# Patient Record
Sex: Male | Born: 1945 | Race: Black or African American | Hispanic: No | Smoking: Never smoker
Health system: Southern US, Community
[De-identification: ages and names within clinical notes are randomized; demographics above are authoritative.]

## PROBLEM LIST (undated history)

## (undated) DIAGNOSIS — F200 Paranoid schizophrenia: Secondary | ICD-10-CM

## (undated) DIAGNOSIS — M109 Gout, unspecified: Secondary | ICD-10-CM

## (undated) DIAGNOSIS — N400 Enlarged prostate without lower urinary tract symptoms: Secondary | ICD-10-CM

## (undated) DIAGNOSIS — F315 Bipolar disorder, current episode depressed, severe, with psychotic features: Secondary | ICD-10-CM

## (undated) DIAGNOSIS — F329 Major depressive disorder, single episode, unspecified: Secondary | ICD-10-CM

## (undated) DIAGNOSIS — F0390 Unspecified dementia without behavioral disturbance: Secondary | ICD-10-CM

## (undated) DIAGNOSIS — E119 Type 2 diabetes mellitus without complications: Secondary | ICD-10-CM

## (undated) DIAGNOSIS — F0280 Dementia in other diseases classified elsewhere without behavioral disturbance: Secondary | ICD-10-CM

## (undated) DIAGNOSIS — E559 Vitamin D deficiency, unspecified: Secondary | ICD-10-CM

## (undated) DIAGNOSIS — G309 Alzheimer's disease, unspecified: Secondary | ICD-10-CM

## (undated) DIAGNOSIS — E785 Hyperlipidemia, unspecified: Secondary | ICD-10-CM

## (undated) DIAGNOSIS — I10 Essential (primary) hypertension: Secondary | ICD-10-CM

## (undated) DIAGNOSIS — S069XAA Unspecified intracranial injury with loss of consciousness status unknown, initial encounter: Secondary | ICD-10-CM

## (undated) DIAGNOSIS — S069X9A Unspecified intracranial injury with loss of consciousness of unspecified duration, initial encounter: Secondary | ICD-10-CM

---

## 2017-07-03 ENCOUNTER — Emergency Department: Payer: Medicare (Managed Care)

## 2017-07-03 ENCOUNTER — Emergency Department
Admission: EM | Admit: 2017-07-03 | Discharge: 2017-07-03 | Disposition: A | Discharge status: Home / Self Care | Payer: Medicare (Managed Care) | Attending: Emergency Medicine | Admitting: Emergency Medicine

## 2017-07-03 ENCOUNTER — Other Ambulatory Visit: Payer: Self-pay

## 2017-07-03 DIAGNOSIS — I10 Essential (primary) hypertension: Secondary | ICD-10-CM | POA: Insufficient documentation

## 2017-07-03 DIAGNOSIS — R109 Unspecified abdominal pain: Secondary | ICD-10-CM | POA: Diagnosis not present

## 2017-07-03 DIAGNOSIS — N200 Calculus of kidney: Secondary | ICD-10-CM | POA: Diagnosis not present

## 2017-07-03 DIAGNOSIS — F0390 Unspecified dementia without behavioral disturbance: Secondary | ICD-10-CM | POA: Diagnosis not present

## 2017-07-03 DIAGNOSIS — R195 Other fecal abnormalities: Secondary | ICD-10-CM | POA: Diagnosis present

## 2017-07-03 HISTORY — DX: Essential (primary) hypertension: I10

## 2017-07-03 HISTORY — DX: Unspecified dementia, unspecified severity, without behavioral disturbance, psychotic disturbance, mood disturbance, and anxiety: F03.90

## 2017-07-03 LAB — URINALYSIS, COMPLETE (UACMP) WITH MICROSCOPIC
BILIRUBIN URINE: NEGATIVE
Bacteria, UA: NONE SEEN
Glucose, UA: NEGATIVE mg/dL
Hgb urine dipstick: NEGATIVE
KETONES UR: NEGATIVE mg/dL
LEUKOCYTES UA: NEGATIVE
Nitrite: NEGATIVE
PH: 8 (ref 5.0–8.0)
PROTEIN: 30 mg/dL — AB
Specific Gravity, Urine: 1.021 (ref 1.005–1.030)

## 2017-07-03 LAB — CBC
HCT: 41.1 % (ref 40.0–52.0)
Hemoglobin: 13.3 g/dL (ref 13.0–18.0)
MCH: 28.5 pg (ref 26.0–34.0)
MCHC: 32.4 g/dL (ref 32.0–36.0)
MCV: 87.9 fL (ref 80.0–100.0)
PLATELETS: 245 10*3/uL (ref 150–440)
RBC: 4.68 MIL/uL (ref 4.40–5.90)
RDW: 13.6 % (ref 11.5–14.5)
WBC: 4.8 10*3/uL (ref 3.8–10.6)

## 2017-07-03 LAB — COMPREHENSIVE METABOLIC PANEL
ALT: 22 U/L (ref 17–63)
AST: 38 U/L (ref 15–41)
Albumin: 4.3 g/dL (ref 3.5–5.0)
Alkaline Phosphatase: 84 U/L (ref 38–126)
Anion gap: 11 (ref 5–15)
BUN: 15 mg/dL (ref 6–20)
CALCIUM: 9.6 mg/dL (ref 8.9–10.3)
CO2: 28 mmol/L (ref 22–32)
CREATININE: 1.15 mg/dL (ref 0.61–1.24)
Chloride: 101 mmol/L (ref 101–111)
GLUCOSE: 136 mg/dL — AB (ref 65–99)
Potassium: 4 mmol/L (ref 3.5–5.1)
Sodium: 140 mmol/L (ref 135–145)
TOTAL PROTEIN: 7.8 g/dL (ref 6.5–8.1)
Total Bilirubin: 1.1 mg/dL (ref 0.3–1.2)

## 2017-07-03 LAB — PROTIME-INR
INR: 1
Prothrombin Time: 13.1 seconds (ref 11.4–15.2)

## 2017-07-03 LAB — TYPE AND SCREEN
ABO/RH(D): O POS
ANTIBODY SCREEN: NEGATIVE

## 2017-07-03 MED ORDER — ONDANSETRON 4 MG PO TBDP
4.0000 mg | ORAL_TABLET | Freq: Once | ORAL | Status: AC
  Administered 2017-07-03: 4 mg via ORAL
  Filled 2017-07-03: qty 1

## 2017-07-03 MED ORDER — TRAMADOL HCL 50 MG PO TABS: 50.0000 mg | ORAL_TABLET | Freq: Four times a day (QID) | ORAL | 0 refills | Status: DC | PRN

## 2017-07-03 MED ORDER — ONDANSETRON 4 MG PO TBDP: 4.0000 mg | ORAL_TABLET | Freq: Four times a day (QID) | ORAL | 0 refills | Status: DC | PRN

## 2017-07-03 MED ORDER — IOPAMIDOL (ISOVUE-300) INJECTION 61%
100.0000 mL | Freq: Once | INTRAVENOUS | Status: AC | PRN
  Administered 2017-07-03: 100 mL via INTRAVENOUS

## 2017-07-03 MED ORDER — QUETIAPINE FUMARATE 25 MG PO TABS
25.0000 mg | ORAL_TABLET | Freq: Once | ORAL | Status: AC
  Administered 2017-07-03: 25 mg via ORAL
  Filled 2017-07-03: qty 1

## 2017-07-03 MED ORDER — TRAMADOL HCL 50 MG PO TABS
50.0000 mg | ORAL_TABLET | ORAL | Status: AC
  Administered 2017-07-03: 50 mg via ORAL
  Filled 2017-07-03: qty 1

## 2017-07-03 MED ORDER — TRAMADOL HCL 50 MG PO TABS
ORAL_TABLET | ORAL | Status: AC
  Filled 2017-07-03: qty 1

## 2017-07-03 MED ORDER — CEPHALEXIN 500 MG PO CAPS: 500.0000 mg | ORAL_CAPSULE | Freq: Two times a day (BID) | ORAL | 0 refills | Status: AC

## 2017-07-03 MED ORDER — SODIUM CHLORIDE 0.9 % IV BOLUS (SEPSIS)
500.0000 mL | Freq: Once | INTRAVENOUS | Status: AC
  Administered 2017-07-03: 500 mL via INTRAVENOUS

## 2017-07-03 MED ORDER — QUETIAPINE FUMARATE 25 MG PO TABS
ORAL_TABLET | ORAL | Status: AC
  Filled 2017-07-03: qty 1

## 2017-07-03 MED ORDER — CEPHALEXIN 500 MG PO CAPS
500.0000 mg | ORAL_CAPSULE | Freq: Once | ORAL | Status: AC
  Administered 2017-07-03: 500 mg via ORAL
  Filled 2017-07-03: qty 1

## 2017-07-03 NOTE — ED Provider Notes (Signed)
[facility] Emergency Department Provider Note   ____________________________________________   First MD Initiated Contact with Patient 07/03/17 1006     (approximate)  I have reviewed the triage vital signs and the nursing notes.   HISTORY  Chief Complaint GI Bleeding  EM caveat: Limited by dementia, poor patient recall  HPI Jaime Miller is a 71 y.o. male who presents with his sister who she reports is his primary caretaker and power of attorney.  S[place], about 3 days ago where he was complaining his stomach hurt and he seemed to have a small constipated bowel movement.  The next day he had a normal bowel movement but noticed a small amount of blood tingeing to it, and then the day after he also had a bowel movement that again had some blood mixed in with it.  Stool is brown.  Sister reports that this has happened to him about once a year ago where he had similar symptoms, and they found he had a urinary tract infection and he got better.  He does not take any blood thinners except for aspirin.  She reports he has no history of bleeding in the past.  Does have a history of dementia for which he takes Seroquel in the mornings and has not had it today  She called her primary doctor set up an appointment in [place], but they advised that he come to the ER to be evaluated prior to any traveling.  Sister reports he does seem to be slightly worse in terms of his memory and "dementia" the last 4-5 days, but he is continued to be up walking, eating, drinking, and is not been any distress but does seem slightly more disoriented now they have been traveling.  No fevers or chills.  No vomiting.  He is not complaining any chest pain.  She has noticed an occasional dry cough the last several days  Past Medical History:  Diagnosis Date  . Dementia   . Hypertension     There are no active  problems to display for this patient.   History reviewed. No pertinent surgical history.  Prior to Admission medications   Medication Sig Start Date End Date Taking? Authorizing Provider  cephALEXin (KEFLEX) 500 MG capsule Take 1 capsule (500 mg total) by mouth 2 (two) times daily for 10 days. 07/03/17 07/13/17  Sharyn CreamerQuale, Mark, MD  ondansetron (ZOFRAN ODT) 4 MG disintegrating tablet Take 1 tablet (4 mg total) by mouth every 6 (six) hours as needed for nausea or vomiting. 07/03/17   Sharyn CreamerQuale, Mark, MD  traMADol (ULTRAM) 50 MG tablet Take 1 tablet (50 mg total) by mouth every 6 (six) hours as needed for severe pain. 07/03/17   Sharyn CreamerQuale, Mark, MD    Allergies Patient has no known allergies.  No family history on file.  Social History Social History   Tobacco Use  . Smoking status: Never Smoker  . Smokeless tobacco: Never Used  Substance Use Topics  . Alcohol use: No    Frequency: Never  . Drug use: No    Review of Systems Patient cannot provide reasonable history  Does not appear in any acute distress, and when asked he denies being any pain.  However, he cannot describe give a history as to why he is here today.   ____________________________________________   PHYSICAL EXAM:  VITAL SIGNS: ED Triage Vitals [07/03/17 0932]  Enc Vitals Group  BP 110/87     Pulse Rate 71     Resp 16     Temp 97.8 F (36.6 C)     Temp Source Oral     SpO2 99 %     Weight 184 lb (83.5 kg)     Height 6\' 2"  (1.88 m)     Head Circumference      Peak Flow      Pain Score      Pain Loc      Pain Edu?      Excl. in GC?     Constitutional: Alert and oriented to self and sister but not to year or location. Well appearing and in no acute distress.  Appears very well cared for, his sister is very pleasant and attentive. Eyes: Conjunctivae are normal. Head: Atraumatic. Nose: No congestion/rhinnorhea. Mouth/Throat: Mucous membranes are moist. Neck: No stridor.  No meningismus. Cardiovascular:  Normal rate, regular rhythm. Grossly normal heart sounds.  Good peripheral circulation. Respiratory: Normal respiratory effort.  No retractions. Lungs CTAB. Gastrointestinal: Soft and question of some tenderness as he seems to grimace slightly when palpated in the lower abdomen. No distention.  No rebound guarding or peritonitis.  Rectal exam performed with nurse Revonda Standard.  Formed brown stool with Hemoccult negative.  No hemorrhoids noted Musculoskeletal: No lower extremity tenderness nor edema. Neurologic: Very flat affect, no distress.  Skin:  Skin is warm, dry and intact. No rash noted.  Moves all extremities equally without any deficits noted Psychiatric: Mood and affect are flat. Speech and behavior are normal.  ____________________________________________   LABS (all labs ordered are listed, but only abnormal results are displayed)  Labs Reviewed  COMPREHENSIVE METABOLIC PANEL - Abnormal; Notable for the following components:      Result Value   Glucose, Bld 136 (*)    All other components within normal limits  URINALYSIS, COMPLETE (UACMP) WITH MICROSCOPIC - Abnormal; Notable for the following components:   Color, Urine YELLOW (*)    APPearance CLEAR (*)    Protein, ur 30 (*)    Squamous Epithelial / LPF 0-5 (*)    All other components within normal limits  CBC  PROTIME-INR  POC OCCULT BLOOD, ED  TYPE AND SCREEN   ____________________________________________  EKG  Reviewed interrupt me at 10:20 AM Heart rate 70 QRS 100 QTC 460 Normal sinus rhythm, no evidence of ischemia or ectopy ____________________________________________  RADIOLOGY  Ct Head Wo Contrast  Result Date: 07/03/2017 CLINICAL DATA:  Altered level of consciousness. EXAM: CT HEAD WITHOUT CONTRAST TECHNIQUE: Contiguous axial images were obtained from the base of the skull through the vertex without intravenous contrast. COMPARISON:  None. FINDINGS: Brain: Mild diffuse cortical atrophy is noted. No mass  effect or midline shift is noted. Ventricular size is within normal limits. There is no evidence of mass lesion, hemorrhage or acute infarction. Vascular: No hyperdense vessel or unexpected calcification. Skull: Normal. Negative for fracture or focal lesion. Sinuses/Orbits: No acute finding. Other: None. IMPRESSION: Mild diffuse cortical atrophy. No acute intracranial abnormality seen. Electronically Signed   By: Lupita Raider, M.D.   On: 07/03/2017 11:27   Ct Abdomen Pelvis W Contrast  Result Date: 07/03/2017 CLINICAL DATA:  Abdominal pain, bloody stools. EXAM: CT ABDOMEN AND PELVIS WITH CONTRAST TECHNIQUE: Multidetector CT imaging of the abdomen and pelvis was performed using the standard protocol following bolus administration of intravenous contrast. CONTRAST:  ISOVUE-300 IOPAMIDOL (ISOVUE-300) INJECTION 61% COMPARISON:  None. FINDINGS: Lower chest: Lung bases are  clear. No effusions. Heart is normal size. Hepatobiliary: No focal hepatic abnormality. Gallbladder unremarkable. Pancreas: No focal abnormality or ductal dilatation. Spleen: No focal abnormality.  Normal size. Adrenals/Urinary Tract: No adrenal abnormality. No focal renal abnormality. There is a 16 mm laminated appearing calcification in the right lower pelvis. It is difficult to follow the decompressed right ureter in the pelvis, but this may be a distal right ureteral stone. Urinary bladder is decompressed, unremarkable. No hydronephrosis or renal stones. Stomach/Bowel: Normal appendix. Stomach, large and small bowel grossly unremarkable. Vascular/Lymphatic: No evidence of aneurysm or adenopathy. Reproductive: Marked enlargement of the prostate, 7.1 x 6.6 cm. Other: No free fluid or free air. Small bilateral inguinal hernias containing fat. Bilateral hydroceles noted. Musculoskeletal: No acute bony abnormality. IMPRESSION: Large (15 mm) laminated calcification in the right pelvis posterior to the right bladder wall. This may reflect the  distal right ureteral stone. There is no evidence of hydronephrosis/obstruction. Markedly enlarged prostate. Electronically Signed   By: Charlett Nose M.D.   On: 07/03/2017 11:27   Dg Chest Portable 1 View  Result Date: 07/03/2017 CLINICAL DATA:  Cough. EXAM: PORTABLE CHEST 1 VIEW COMPARISON:  None. FINDINGS: The heart size and mediastinal contours are within normal limits. Both lungs are clear. The visualized skeletal structures are unremarkable. No pneumothorax or pleural effusion is noted. IMPRESSION: No acute cardiopulmonary abnormality seen. Electronically Signed   By: Lupita Raider, M.D.   On: 07/03/2017 10:43    Radiology results reviewed, chest x-ray no acute, head CT no acute, abdomen and pelvis suspicious for a possible 15 mm kidney stone without hydronephrosis or complication around the right posterior pelvis/bladder wall.      ____________________________________________   PROCEDURES  Procedure(s) performed: None  Procedures  Critical Care performed: No  ____________________________________________   INITIAL IMPRESSION / ASSESSMENT AND PLAN / ED COURSE  Pertinent labs & imaging results that were available during my care of the patient were reviewed by me and considered in my medical decision making (see chart for details).  Patient with a history of dementia presents for concerns of slight increase in his confusion and sister seeing some blood in his stools the last couple of days after 1 day where he had complained of abdominal pain.  He does not appear in any acute distress, his vital signs are stable on presentation.  Overall I question if he may be having some abdominal pain though based on my exam, and is somewhat difficult because the patient dementia limits this but his sister provides a very good history.  I will examine with a very thorough workup for reported worsening his confusion and also possible blood in stool.  Also sister reports a recent cough, but he  appears in no distress with normal oxygen saturations and respirations.  Sister would like to have him evaluated to assure that there are no signs of acute concern before they plan to travel back to [place].  I think this is agreeable, I have ordered a head CT to evaluate for any acute etiology for slight worsening mental status which I suspect may be secondary to dementia although a secondary etiology strongly considered.  Chest x-ray, CT abdomen pelvis and basic labs.   ----------------------------------------- 2:55 PM on 07/03/2017 -----------------------------------------  Patient is resting comfortably, in no distress.  Discussed with patient, he reports no pain.  Caretaker reports he seems to be acting normally and she is comfortable to plan to take him home.  At this point in reviewing of  CT, I suspect he may have started passing a kidney stone a couple days ago.  There is no evidence of superinfection, but he is not actively having nausea vomiting or pain.  I discussed further with them options and treatment recommendations, and they advised that the plan is to try to get him home to [place] and they will be driving there today.  I do not think this is unreasonable but will give him a tramadol, Zofran and cephalexin prophylactically and they will follow-up with her doctor on Monday in [place].  Return precautions and treatment recommendations and follow-up discussed with the patient's sister who is agreeable with the plan.   ____________________________________________   FINAL CLINICAL IMPRESSION(S) / ED DIAGNOSES  Final diagnoses:  Kidney stone on right side  Dementia without behavioral disturbance, unspecified dementia type      NEW MEDICATIONS STARTED DURING THIS VISIT:  This SmartLink is deprecated. Use AVSMEDLIST instead to display the medication list for a patient.   Note:  This document was prepared using Dragon voice recognition software and may include unintentional  dictation errors.     Sharyn CreamerQuale, Mark, MD 07/03/17 [phone]

## 2017-07-03 NOTE — ED Triage Notes (Addendum)
Per pt wife pt has been having abd pain and having bloody stools and increased aggitation over the past couple of days. Per pt wife pt has dementia at baseline

## 2017-07-03 NOTE — ED Notes (Addendum)
With EDP at bedside. EDP performed rectal exam. Pt tolerated well.

## 2017-07-03 NOTE — Clinical Social Work Note (Signed)
Clinical Social Work Assessment  Patient Details  Name: Jaime Miller MRN: [id number] Date of Birth: 08/12/1945  Date of referral:  07/03/17               Reason for consult:  Family Concerns, Discharge Planning                Permission sought to share information with:  Family Supports Permission granted to share information::  Yes, Verbal Permission Granted  Name::     Celedonio Miyamotoaulette Pride  ( sister) [phone]  Agency::     Relationship::     Contact Information:     Housing/Transportation Living arrangements for the past 2 months:  Apartment Source of Information:  Other (Comment Required)(Sister) Patient Interpreter Needed:  None Criminal Activity/Legal Involvement Pertinent to Current Situation/Hospitalization:  No - Comment as needed Significant Relationships:  Siblings Lives with:  Self Do you feel safe going back to the place where you live?  Yes Need for family participation in patient care:  Yes (Comment)  Care giving concerns: Sister requested some resources.   Social Worker assessment / plan: LCSW introduced myself to patient and his sister ( was informed that patient had head injury and TBI and has some cognitive issues. He is alert and oriented x3. He has been diagnosed with dementia from Dr Verdie DrownShimmy in [place]. Patient has been [place]rough [place] [employer] by his sister and she requested information for rest homes, SNF and this worker provided eldercare list, SNF and ALF and memory care rest homes. It was mentioned that she contact the DSS for in depth step by step to have her brothers [place] [facility] [place].He does live in his own apartment and has 2 workers in home support and the sister feels this is not enough for him [place]. She was encouraged to follow up with his primary physician in [place] and to [facility] with [facility] [place]. Several handouts were provided to sister and there are no further needs.  Employment status:   Disabled (Comment on whether or not currently receiving Disability) Insurance information:  Medicare PT Recommendations:    Information / Referral to community resources:     Patient/Family's Response to care: Good understanding  Patient/Family's Understanding of and Emotional Response to Diagnosis, Current Treatment, and Prognosis:  Family understands the process involved to transfer benefits from state to state and will follow up with community resources as required.  Emotional Assessment Appearance:  Appears stated age Attitude/Demeanor/Rapport:  Inconsistent, Guarded Affect (typically observed):  Calm, Agitated Orientation:    Alcohol / Substance use:    Psych involvement (Current and /or in the community):     Discharge Needs  Concerns to be addressed:  Discharge Planning Concerns Readmission within the last 30 days:  No Current discharge risk:  None Barriers to Discharge:  Other(Patient is from another STATE)   Johnella MoloneyBandi, [place] M, [place] 07/03/2017, 12:26 PM

## 2017-07-03 NOTE — Progress Notes (Signed)
LCSW met with patients sister who reports patient is from [place] and has 2 workers ( home health) and resides on his own with in home support. She was asking for information for dementia services and list of facilities that she might be able to access for her brother in the future.  His agitation has increased and his appetite has decreased. Handouts provided to his sister. No further needs.  Ahtziri Jeffries LCSW

## 2017-07-03 NOTE — Discharge Instructions (Signed)
Follow-up closely with your doctor in [place] this coming week.  Use prescriptions as prescribed.  I would suggest obtaining our medical records for review by your doctor on Monday.  Please return to the emergency room right away if you are to develop a fever, severe nausea, your pain becomes severe or worsens, you are unable to keep food down, begin vomiting any dark or bloody fluid, you develop any dark or bloody stools, feel dehydrated, or other new concerns or symptoms arise.

## 2017-07-03 NOTE — ED Notes (Signed)
ED Provider at bedside. 

## 2017-07-03 NOTE — ED Notes (Signed)
Pt family requesting to speak with Child psychotherapistsocial worker; EDP informed

## 2017-08-16 ENCOUNTER — Emergency Department
Admission: EM | Admit: 2017-08-16 | Discharge: 2017-08-18 | Disposition: A | Discharge status: Home / Self Care | Payer: Medicare (Managed Care) | Attending: Emergency Medicine | Admitting: Emergency Medicine

## 2017-08-16 ENCOUNTER — Other Ambulatory Visit: Payer: Self-pay

## 2017-08-16 ENCOUNTER — Encounter: Payer: Self-pay | Admitting: Emergency Medicine

## 2017-08-16 DIAGNOSIS — R451 Restlessness and agitation: Secondary | ICD-10-CM | POA: Diagnosis present

## 2017-08-16 DIAGNOSIS — E119 Type 2 diabetes mellitus without complications: Secondary | ICD-10-CM | POA: Diagnosis not present

## 2017-08-16 DIAGNOSIS — Z79899 Other long term (current) drug therapy: Secondary | ICD-10-CM | POA: Diagnosis not present

## 2017-08-16 DIAGNOSIS — I10 Essential (primary) hypertension: Secondary | ICD-10-CM | POA: Diagnosis not present

## 2017-08-16 DIAGNOSIS — G309 Alzheimer's disease, unspecified: Secondary | ICD-10-CM | POA: Diagnosis not present

## 2017-08-16 DIAGNOSIS — F0281 Dementia in other diseases classified elsewhere with behavioral disturbance: Secondary | ICD-10-CM | POA: Diagnosis not present

## 2017-08-16 HISTORY — DX: Gout, unspecified: M10.9

## 2017-08-16 HISTORY — DX: Dementia in other diseases classified elsewhere, unspecified severity, without behavioral disturbance, psychotic disturbance, mood disturbance, and anxiety: F02.80

## 2017-08-16 HISTORY — DX: Bipolar disorder, current episode depressed, severe, with psychotic features: F31.5

## 2017-08-16 HISTORY — DX: Alzheimer's disease, unspecified: G30.9

## 2017-08-16 HISTORY — DX: Unspecified intracranial injury with loss of consciousness status unknown, initial encounter: S06.9XAA

## 2017-08-16 HISTORY — DX: Major depressive disorder, single episode, unspecified: F32.9

## 2017-08-16 HISTORY — DX: Hyperlipidemia, unspecified: E78.5

## 2017-08-16 HISTORY — DX: Vitamin D deficiency, unspecified: E55.9

## 2017-08-16 HISTORY — DX: Benign prostatic hyperplasia without lower urinary tract symptoms: N40.0

## 2017-08-16 HISTORY — DX: Paranoid schizophrenia: F20.0

## 2017-08-16 HISTORY — DX: Type 2 diabetes mellitus without complications: E11.9

## 2017-08-16 HISTORY — DX: Unspecified intracranial injury with loss of consciousness of unspecified duration, initial encounter: S06.9X9A

## 2017-08-16 LAB — URINE DRUG SCREEN, QUALITATIVE (ARMC ONLY)
AMPHETAMINES, UR SCREEN: NOT DETECTED
BENZODIAZEPINE, UR SCRN: NOT DETECTED
Barbiturates, Ur Screen: NOT DETECTED
Cannabinoid 50 Ng, Ur ~~LOC~~: NOT DETECTED
Cocaine Metabolite,Ur ~~LOC~~: NOT DETECTED
MDMA (Ecstasy)Ur Screen: NOT DETECTED
METHADONE SCREEN, URINE: NOT DETECTED
OPIATE, UR SCREEN: NOT DETECTED
Phencyclidine (PCP) Ur S: NOT DETECTED
TRICYCLIC, UR SCREEN: NOT DETECTED

## 2017-08-16 LAB — COMPREHENSIVE METABOLIC PANEL
ALBUMIN: 4.7 g/dL (ref 3.5–5.0)
ALT: 17 U/L (ref 17–63)
ANION GAP: 12 (ref 5–15)
AST: 35 U/L (ref 15–41)
Alkaline Phosphatase: 74 U/L (ref 38–126)
BILIRUBIN TOTAL: 1 mg/dL (ref 0.3–1.2)
BUN: 31 mg/dL — ABNORMAL HIGH (ref 6–20)
CO2: 26 mmol/L (ref 22–32)
Calcium: 9.8 mg/dL (ref 8.9–10.3)
Chloride: 99 mmol/L — ABNORMAL LOW (ref 101–111)
Creatinine, Ser: 1.8 mg/dL — ABNORMAL HIGH (ref 0.61–1.24)
GFR calc Af Amer: 41 mL/min — ABNORMAL LOW (ref >60)
GFR calc non Af Amer: 36 mL/min — ABNORMAL LOW (ref >60)
GLUCOSE: 256 mg/dL — AB (ref 65–99)
POTASSIUM: 4.3 mmol/L (ref 3.5–5.1)
SODIUM: 137 mmol/L (ref 135–145)
Total Protein: 8.7 g/dL — ABNORMAL HIGH (ref 6.5–8.1)

## 2017-08-16 LAB — SALICYLATE LEVEL

## 2017-08-16 LAB — CBC
HEMATOCRIT: 40.7 % (ref 40.0–52.0)
Hemoglobin: 13.5 g/dL (ref 13.0–18.0)
MCH: 29.3 pg (ref 26.0–34.0)
MCHC: 33.1 g/dL (ref 32.0–36.0)
MCV: 88.4 fL (ref 80.0–100.0)
PLATELETS: 279 10*3/uL (ref 150–440)
RBC: 4.61 MIL/uL (ref 4.40–5.90)
RDW: 13.7 % (ref 11.5–14.5)
WBC: 4.3 10*3/uL (ref 3.8–10.6)

## 2017-08-16 LAB — BASIC METABOLIC PANEL
ANION GAP: 8 (ref 5–15)
BUN: 26 mg/dL — ABNORMAL HIGH (ref 6–20)
CO2: 26 mmol/L (ref 22–32)
Calcium: 8.8 mg/dL — ABNORMAL LOW (ref 8.9–10.3)
Chloride: 104 mmol/L (ref 101–111)
Creatinine, Ser: 1.31 mg/dL — ABNORMAL HIGH (ref 0.61–1.24)
GFR, EST NON AFRICAN AMERICAN: 52 mL/min — AB (ref >60)
Glucose, Bld: 91 mg/dL (ref 65–99)
POTASSIUM: 3.7 mmol/L (ref 3.5–5.1)
SODIUM: 138 mmol/L (ref 135–145)

## 2017-08-16 LAB — ACETAMINOPHEN LEVEL

## 2017-08-16 LAB — ETHANOL: Alcohol, Ethyl (B): <10 mg/dL (ref <10)

## 2017-08-16 MED ORDER — SODIUM CHLORIDE 0.9 % IV BOLUS (SEPSIS)
1000.0000 mL | Freq: Once | INTRAVENOUS | Status: AC
  Administered 2017-08-16: 1000 mL via INTRAVENOUS

## 2017-08-16 MED ORDER — ZIPRASIDONE MESYLATE 20 MG IM SOLR
20.0000 mg | Freq: Once | INTRAMUSCULAR | Status: AC
  Administered 2017-08-16: 20 mg via INTRAMUSCULAR
  Filled 2017-08-16: qty 20

## 2017-08-16 NOTE — ED Provider Notes (Signed)
[facility] Emergency Department Provider Note ____________________________________________   First MD Initiated Contact with Patient 08/16/17 1329     (approximate)  I have reviewed the triage vital signs and the nursing notes.   HISTORY  Chief Complaint Agitation  History of present illness is significantly limited due to dementia.  HPI Leverne Humblesrthur Lee Yeates is a 72 y.o. male with past medical history as noted below who presents from his group home for agitation.  Per his sister, the patient just [place] this [place] about 8 days ago and went to a group home.  He did not have prior issues to the group home until today.  Per her, he has had in dictation since his dose of Seroquel was changed a few months ago.  The patient himself is confused but does not have any complaints at this time.   Past Medical History:  Diagnosis Date  . Alzheimer disease   . Bipolar I disorder, most recent episode (or current) depressed, severe, specified as with psychotic behavior (HCC)   . BPH without obstruction/lower urinary tract symptoms   . Dementia   . Diabetes mellitus without complication (HCC)   . Gout   . Hyperlipidemia   . Hypertension   . Major depressive disorder   . Paranoid schizophrenia (HCC)   . TBI (traumatic brain injury) (HCC)   . Vitamin D deficiency     There are no active problems to display for this patient.   No past surgical history on file.  Prior to Admission medications   Medication Sig Start Date End Date Taking? Authorizing Provider  bisacodyl (DULCOLAX) 10 MG suppository Place 10 mg rectally daily as needed for moderate constipation.   Yes [provider]  Cholecalciferol 10000 units CAPS Take 1 capsule by mouth daily.   Yes [provider]  docusate sodium (COLACE) 100 MG capsule Take 100 mg by mouth 2 (two) times daily.   Yes [provider]  donepezil (ARICEPT) 10 MG tablet Take 10 mg by mouth at  bedtime.   Yes [provider]  escitalopram (LEXAPRO) 5 MG tablet Take 5 mg by mouth daily.   Yes [provider]  finasteride (PROSCAR) 5 MG tablet Take 5 mg by mouth daily.   Yes [provider]  lisinopril-hydrochlorothiazide (PRINZIDE,ZESTORETIC) 10-12.5 MG tablet Take 1 tablet by mouth daily.   Yes [provider]  memantine (NAMENDA) 10 MG tablet Take 10 mg by mouth 2 (two) times daily.   Yes [provider]  QUEtiapine (SEROQUEL) 25 MG tablet Take 25 mg by mouth at bedtime.   Yes [provider]  QUEtiapine (SEROQUEL) 50 MG tablet Take 50 mg by mouth every morning.   Yes [provider]  senna (SENOKOT) 8.6 MG tablet Take 1 tablet by mouth at bedtime.   Yes [provider]  simvastatin (ZOCOR) 10 MG tablet Take 10 mg by mouth at bedtime.   Yes [provider]  tamsulosin (FLOMAX) 0.4 MG CAPS capsule Take 0.4 mg by mouth every evening.   Yes [provider]  TUBERCULIN PPD ID Inject into the skin. NEXT DOSE DUE January 15 3pm-11pm for step 2 dose. 08/18/17  Yes [provider]  ondansetron (ZOFRAN ODT) 4 MG disintegrating tablet Take 1 tablet (4 mg total) by mouth every 6 (six) hours as needed for nausea or vomiting. 07/03/17   Sharyn CreamerQuale, Mark, MD  traMADol (ULTRAM) 50 MG tablet Take 1 tablet (50 mg total) by  mouth every 6 (six) hours as needed for severe pain. 07/03/17   Sharyn Creamer, MD    Allergies Etodolac  No family history on file.  Social History Social History   Tobacco Use  . Smoking status: Never Smoker  . Smokeless tobacco: Never Used  Substance Use Topics  . Alcohol use: No    Frequency: Never  . Drug use: No    Review of Systems Level V caveat: Unable to obtain review of systems due to dementia    ____________________________________________   PHYSICAL EXAM:  VITAL SIGNS: ED Triage Vitals  Enc Vitals Group     BP 08/16/17 1234 125/88     Pulse Rate 08/16/17  1234 (!) 107     Resp 08/16/17 1234 (!) 22     Temp 08/16/17 1234 98.5 F (36.9 C)     Temp Source 08/16/17 1234 Oral     SpO2 08/16/17 1234 100 %     Weight 08/16/17 1242 180 lb (81.6 kg)     Height 08/16/17 1242 5\' 10"  (1.778 m)     Head Circumference --      Peak Flow --      Pain Score --      Pain Loc --      Pain Edu? --      Excl. in GC? --     Constitutional: Alert, confused.  Eyes: Conjunctivae are normal.  EOMI.  PERRLA. Head: Atraumatic. Nose: No congestion/rhinnorhea. Mouth/Throat: Mucous membranes are somewhat dry.   Neck: Normal range of motion.  Cardiovascular: Borderline tachycardic, regular rhythm. Grossly normal heart sounds.  Good peripheral circulation. Respiratory: Normal respiratory effort.  No retractions. Lungs CTAB. Gastrointestinal: Soft and nontender. No distention.  Genitourinary: No flank tenderness. Musculoskeletal: No lower extremity edema.  Extremities warm and well perfused.  Neurologic:  Normal speech and language.  Motor intact in all extremities.  No gross focal neurologic deficits are appreciated.  Skin:  Skin is warm and dry. No rash noted. Psychiatric: Confused.  ____________________________________________   LABS (all labs ordered are listed, but only abnormal results are displayed)  Labs Reviewed  COMPREHENSIVE METABOLIC PANEL - Abnormal; Notable for the following components:      Result Value   Chloride 99 (*)    Glucose, Bld 256 (*)    BUN 31 (*)    Creatinine, Ser 1.80 (*)    Total Protein 8.7 (*)    GFR calc non Af Amer 36 (*)    GFR calc Af Amer 41 (*)    All other components within normal limits  ACETAMINOPHEN LEVEL - Abnormal; Notable for the following components:   Acetaminophen (Tylenol), Serum <10 (*)    All other components within normal limits  ETHANOL  SALICYLATE LEVEL  CBC  URINE DRUG SCREEN, QUALITATIVE ([facility] ONLY)  BASIC METABOLIC PANEL    ____________________________________________  EKG   ____________________________________________  RADIOLOGY    ____________________________________________   PROCEDURES  Procedure(s) performed: No    Critical Care performed: No ____________________________________________   INITIAL IMPRESSION / ASSESSMENT AND PLAN / ED COURSE  Pertinent labs & imaging results that were available during my care of the patient were reviewed by me and considered in my medical decision making (see chart for details).  72 year old male with history of dementia, schizophrenia, and other past medical history as noted above presents from a group home with agitation.  The patient's sister states that he has been increasingly agitated and erratic ever since his dose of Seroquel was changed a  few months ago.  On exam, the patient was slightly tachycardic at triage but the other vital signs are normal, and the patient is comfortable appearing with generally unremarkable exam except for slightly dry mucous membranes.  He is alert but confused, and at baseline mental status per his sister.  Initial labs reveal slightly elevated creatinine from his baseline, with elevated BUN and most consistent with dehydration.  Otherwise unremarkable.  Differential for the behavioral symptom includes advancing dementia, medication noncompliance, or acute psychiatric etiology.  Plan: IV fluids, recheck BMP, [facility] consult as patient may need placement.  Patient is here with his family member at this time and does not require IVC.   ----------------------------------------- 3:39 PM on 08/16/2017 -----------------------------------------   Patient has been evaluated by [facility].  The [facility] provider recommended potentially given the patient a higher dose of Seroquel and sending him back to the group home, however the group home will not accept the patient back.  Therefore the [facility] recommends that he will need placement elsewhere.   Patient will board in the ED.  I am signing the patient out to the oncoming physician Dr. Lenard Lance.  Patient is pending repeat BMP after fluids to verify that the creatinine is improving.  If medically clear will await dispo per psych. ____________________________________________   FINAL CLINICAL IMPRESSION(S) / ED DIAGNOSES  Final diagnoses:  Alzheimer's dementia with behavioral disturbance, unspecified timing of dementia onset      NEW MEDICATIONS STARTED DURING THIS VISIT:  New Prescriptions   No medications on file     Note:  This document was prepared using Dragon voice recognition software and may include unintentional dictation errors.    Dionne Bucy, MD 08/16/17 1539

## 2017-08-16 NOTE — ED Notes (Signed)
Patient tolerated injection of geodon well at this time.  Patient redirected back to bed.

## 2017-08-16 NOTE — ED Notes (Addendum)
Sister in room, Dr. Marisa SeverinSiadecki updated her on the plan of care. Plan to move patient out of room 22 and into 24 soon. Sister attempting to get pt to eat something.   Per sister, patient has been living at a Care Home [address].   Jaime Miller (sister) - [phone] states she is the legal guardian

## 2017-08-16 NOTE — ED Provider Notes (Signed)
-----------------------------------------   6:51 PM on 08/16/2017 -----------------------------------------  Patient's repeat metabolic panel shows good improvement in the patient's creatinine after hydration.  Patient has been seen by specialist on-call who stated the patient could be discharged back to his group home with medication adjustments.  Group home states they are not willing to accept the patient back at this time.  We will have social worker see the patient tomorrow to reassess and hopefully find placement for the patient.   Minna AntisPaduchowski, Mishon Blubaugh, MD 08/16/17 [phone]

## 2017-08-16 NOTE — ED Notes (Signed)
Pt lying quietly in bed with eyes closed at this time.

## 2017-08-16 NOTE — ED Notes (Signed)
Report to Nord[employer]all RN, confirmed with dr. Marisa SeverinSiadecki that the patient is to get 2 L NS.  Pt placed in room 24 on hospital bed. Sister Paulette at bedside. Secretary notified to [facility].

## 2017-08-16 NOTE — ED Notes (Signed)
[facility]  [phone]

## 2017-08-16 NOTE — ED Notes (Addendum)
Pt dressed out in [employer] scrubs by this Research scientist (medical)N and Rover Melissa.  Pt cooperative and calm. Pt is alert to self only. Pt has small abrasion to left palm, unknown how injury occurred.  Unknown if patient has ever fallen, yellow socks on patient, fall risk arm band on and bed alarm on at this time.  Pt unable to void at this time. Pt given warm blanket.  Pt lying quietly in bed at this time.

## 2017-08-16 NOTE — ED Triage Notes (Signed)
Pt arrived via EMS from unknown group home. States staff reports patient became agitated today, has hx of dementia and thought he was being robbed. Pt was chasing after staff with piece of wood.  Per EMS pt was agitated with them, but not combative.  Pt is lying on stretcher at this time but is restless.  Pt cooperative on arrival.

## 2017-08-16 NOTE — ED Notes (Signed)
This RN spoke with MD in regards to care plan update.  Patient completed SOC who states that the patient could return to his group home.  The group home is refusing to take the patient back.  Social work consult has been placed, and patient will board here for the evening.

## 2017-08-16 NOTE — ED Notes (Signed)
Per Dr. Marisa SeverinSiadecki, pt does not need safety sitter at this time, pt is cooperative and calm, sister is visiting at this time.

## 2017-08-16 NOTE — ED Notes (Signed)
1:1 safety sitter placed with patient.

## 2017-08-16 NOTE — ED Notes (Signed)
Attempted to get urine sample, pt unable to follow commands to get urine sample. Pt assisted back to bed.  Bed alarm on.

## 2017-08-16 NOTE — ED Notes (Signed)
Patient attempting to get out of bed and his sister/legal guardian left the facility.  1:1 sitter placed with patient at this time for safety.

## 2017-08-17 MED ORDER — QUETIAPINE FUMARATE 50 MG PO TABS: 50.0000 mg | ORAL_TABLET | Freq: Two times a day (BID) | ORAL | 1 refills | Status: AC

## 2017-08-17 MED ORDER — ZIPRASIDONE MESYLATE 20 MG IM SOLR
20.0000 mg | Freq: Once | INTRAMUSCULAR | Status: AC
  Administered 2017-08-17: 20 mg via INTRAMUSCULAR

## 2017-08-17 MED ORDER — LORAZEPAM 2 MG/ML IJ SOLN
1.0000 mg | Freq: Once | INTRAMUSCULAR | Status: AC
  Administered 2017-08-17: 1 mg via INTRAVENOUS
  Filled 2017-08-17: qty 1

## 2017-08-17 MED ORDER — FINASTERIDE 5 MG PO TABS
5.0000 mg | ORAL_TABLET | Freq: Every day | ORAL | Status: DC
  Administered 2017-08-18: 5 mg via ORAL
  Filled 2017-08-17: qty 1

## 2017-08-17 MED ORDER — SENNA 8.6 MG PO TABS: 1.0000 | ORAL_TABLET | Freq: Every day | ORAL | Status: DC

## 2017-08-17 MED ORDER — QUETIAPINE FUMARATE 25 MG PO TABS
25.0000 mg | ORAL_TABLET | Freq: Two times a day (BID) | ORAL | Status: DC
  Administered 2017-08-17 – 2017-08-18 (×2): 25 mg via ORAL
  Filled 2017-08-17: qty 1

## 2017-08-17 MED ORDER — QUETIAPINE FUMARATE 25 MG PO TABS
ORAL_TABLET | ORAL | Status: AC
  Filled 2017-08-17: qty 2

## 2017-08-17 MED ORDER — QUETIAPINE FUMARATE 25 MG PO TABS
ORAL_TABLET | ORAL | Status: AC
  Filled 2017-08-17: qty 1

## 2017-08-17 MED ORDER — ZIPRASIDONE MESYLATE 20 MG IM SOLR
INTRAMUSCULAR | Status: AC
  Administered 2017-08-17: 20 mg via INTRAMUSCULAR
  Filled 2017-08-17: qty 20

## 2017-08-17 MED ORDER — DOCUSATE SODIUM 100 MG PO CAPS
100.0000 mg | ORAL_CAPSULE | Freq: Two times a day (BID) | ORAL | Status: DC
  Administered 2017-08-18: 100 mg via ORAL
  Filled 2017-08-17: qty 1

## 2017-08-17 MED ORDER — SIMVASTATIN 10 MG PO TABS: 10.0000 mg | ORAL_TABLET | Freq: Every day | ORAL | Status: DC

## 2017-08-17 MED ORDER — DONEPEZIL HCL 5 MG PO TABS: 10.0000 mg | ORAL_TABLET | Freq: Every day | ORAL | Status: DC

## 2017-08-17 MED ORDER — QUETIAPINE FUMARATE 25 MG PO TABS: 50.0000 mg | ORAL_TABLET | Freq: Every day | ORAL | Status: DC

## 2017-08-17 MED ORDER — LISINOPRIL 10 MG PO TABS
10.0000 mg | ORAL_TABLET | Freq: Every day | ORAL | Status: DC
  Administered 2017-08-18: 10 mg via ORAL
  Filled 2017-08-17: qty 1

## 2017-08-17 MED ORDER — MEMANTINE HCL 5 MG PO TABS
10.0000 mg | ORAL_TABLET | Freq: Two times a day (BID) | ORAL | Status: DC
  Administered 2017-08-18: 10 mg via ORAL
  Filled 2017-08-17: qty 2

## 2017-08-17 MED ORDER — ESCITALOPRAM OXALATE 10 MG PO TABS
5.0000 mg | ORAL_TABLET | Freq: Every day | ORAL | Status: DC
  Administered 2017-08-18: 5 mg via ORAL
  Filled 2017-08-17: qty 1

## 2017-08-17 NOTE — ED Notes (Signed)
Per Dr. Toni Amendlapacs, give 25mg  seroquel now to decrease patient agitation.

## 2017-08-17 NOTE — ED Notes (Signed)
Pt did not eat, tried breaking up food and feeding pt by hand but pt would not eat it. Pt did drink one full cup of orange juice. Quad nurse notified.

## 2017-08-17 NOTE — ED Notes (Signed)

## 2017-08-17 NOTE — ED Notes (Signed)
BEHAVIORAL HEALTH ROUNDING Patient sleeping: No. Patient alert and oriented: no Behavior appropriate: Yes.  ; If no, describe:  Nutrition and fluids offered: Yes  Toileting and hygiene offered: Yes  Sitter present: yes Law enforcement present: No

## 2017-08-17 NOTE — ED Notes (Signed)
Spoke with Starbucks CorporationPaulette Pride, patient's sister and POA, who states she will be [facility] today about possible alternative [facility] and will be coming by later to visit and provide copy of POA. This RN informed family that CSW would be in contact with her regarding placement as well.

## 2017-08-17 NOTE — ED Notes (Signed)
Patient getting more agitated and increasing volume of voice. Patient continues to attempt to leave room. Safety sitter remains at bedside. MD aware.

## 2017-08-17 NOTE — ED Notes (Signed)
Pt assisted to toilet by ed staff, pt urinated.

## 2017-08-17 NOTE — ED Provider Notes (Signed)
-----------------------------------------   3:23 PM on 08/17/2017 -----------------------------------------   Blood pressure 127/75, pulse (!) 50, temperature 97.9 F (36.6 C), temperature source Oral, resp. rate 16, height 5\' 10"  (1.778 m), weight 81.6 kg (180 lb), SpO2 100 %.  The patient had no acute events since last update.  Calm and cooperative at this time.  Disposition is pending Psychiatry/Behavioral Medicine team recommendations.     Myrna BlazerSchaevitz, David Matthew, MD 08/17/17 [phone]

## 2017-08-17 NOTE — ED Notes (Signed)
Patient continually trying to get out of bed. Bed alarm on. Charge nurse notified of need for 1:1 sitter.

## 2017-08-17 NOTE — ED Notes (Signed)
Pt very agitated, trying to get out of bed, unable to get patient to take PO medications.  VO received from Dr. Darnelle CatalanMalinda for geodon 20mg  IM.

## 2017-08-17 NOTE — Clinical Social Work Note (Signed)
CSW received consult for "group home will not take back." Pt was a [facility] at [facility] for 8 days before being brought to the ED for demonstrating agitation and paranoia. CS[facility] [phone] and has not received a call back yet. CSW spoke with pt's sister-Paulette Pride who is currently attempting to secure placement for pt. Pt's benefits are out of network as he has transitioned here from [place]. Pt is paying for placement out of pocket. CSW assisting with locating memory care ALF or Mental Health group home placement. Pt remaining in the ED for observation this evening. CSW continuing to assist with placement. However, sister is aware pt cannot remain in ED until placed. Sister expressed understanding and is willing to take him to her house as a last resort. CSW continuing to follow for discharge planning.   Corlis HoveJeneya Lorina Duffner, Theresia MajorsLCSWA, [facility] Clinical Social Worker-ED [phone]

## 2017-08-17 NOTE — ED Notes (Signed)
Patient assisted to the bathroom 

## 2017-08-17 NOTE — ED Notes (Signed)
1:1 safety sitter at bedside 

## 2017-08-17 NOTE — Progress Notes (Signed)
SNF Benefits:  Number called: [phone] Rep: [place] Reference Number: [id number]  [employer] Dual HMO SNP plan active as of 08/04/17.  $0 deductible.   [phone] out of pocket max, $0 met so far.    In-network SNF: 20% co-insurance for SNF services, though Medicaid would pick up co-insurance cost.   3 day hospital stay not required.   Auth required through [place]: [phone].    Note:  Confirmed with rep that only facilities in [place] qualify as In-Network.   [place] facilities considered out of network, though a single case agreement can be extended if SNF services medically required when patient out of state.   Number to call for single case authorizations: [phone].

## 2017-08-17 NOTE — ED Notes (Signed)
Patient given breakfast tray.

## 2017-08-17 NOTE — ED Notes (Signed)
VOL  PENDING  PLACEMENT 

## 2017-08-17 NOTE — Consult Note (Signed)
Psychiatry: I had not seen this patient earlier today because the tele-psychiatrist had already seen him and it was my understanding the patient was being placed.  Spoke with TTS and social work this evening.  Placement issues are still pending.  Patient had not been placed back on his prescription medicines.  He is still having some agitation in the emergency room.  I have placed orders to restart his medicines both medical and psychiatric.  Social work is still working on possible placement either back to where he came from or another living facility.  Family has expressed some willingness to take him home but only as a last resort.

## 2017-08-17 NOTE — ED Notes (Signed)
Patient given meal tray. Safety Sitter at bedside.

## 2017-08-17 NOTE — ED Notes (Signed)
Pt given meal tray, banana peeled and broken up for pt. Tray includes egg biscuit and orange juice. Orange juice placed in cup with lid and straw. Pt consuming same.

## 2017-08-17 NOTE — ED Notes (Signed)
Patient moved to room 20 via wheelchair with sitter in attendance. Pivots to wheelchair with assist only. Smiling and non resistant.

## 2017-08-18 MED ORDER — LORAZEPAM 2 MG/ML IJ SOLN
INTRAMUSCULAR | Status: AC
  Filled 2017-08-18: qty 1

## 2017-08-18 MED ORDER — LORAZEPAM 2 MG/ML IJ SOLN
1.0000 mg | Freq: Once | INTRAMUSCULAR | Status: AC
  Administered 2017-08-18: 1 mg via INTRAVENOUS

## 2017-08-18 NOTE — ED Notes (Addendum)
Helped pt to reposition in bed. Pt very restless and trying to get out of bed. Warm blanket given.

## 2017-08-18 NOTE — NC FL2 (Signed)
[place] MEDICAID FL2 LEVEL OF CARE SCREENING TOOL     IDENTIFICATION  Patient Name: Jaime Miller Birthdate: 06/14/1946 Sex: male Admission Date (Current Location): 08/16/2017  [place] and [place] Number:  Chiropodist and Address:  [facility], [address] [place], [place] [address]      Provider Number: [id number]  Attending Physician Name and Address:  No att. providers found  Relative Name and Phone Number:  Sister Celedonio Miyamoto [phone]    Current Level of Care: Hospital Recommended Level of Care: Memory Care Prior Approval Number:    Date Approved/Denied:   PASRR Number:    Discharge Plan: Domiciliary (Rest home)(Memory Care/ALF)    Current Diagnoses: There are no active problems to display for this patient.   Orientation RESPIRATION BLADDER Height & Weight     Self  Normal Incontinent Weight: 180 lb (81.6 kg) Height:  5\' 10"  (177.8 cm)  BEHAVIORAL SYMPTOMS/MOOD NEUROLOGICAL BOWEL NUTRITION STATUS  Wanderer   Incontinent Diet(Normal Diet)  AMBULATORY STATUS COMMUNICATION OF NEEDS Skin   Limited Assist Verbally Normal                       Personal Care Assistance Level of Assistance  Bathing, Feeding, Dressing Bathing Assistance: Limited assistance Feeding assistance: Independent Dressing Assistance: Limited assistance     Functional Limitations Info  Sight, Hearing, Speech Sight Info: Adequate Hearing Info: Adequate Speech Info: Adequate    SPECIAL CARE FACTORS FREQUENCY                       Contractures Contractures Info: Not present    Additional Factors Info  Code Status, Allergies, Psychotropic Code Status Info: Full Allergies Info: Etodolac Psychotropic Info: See MAR         Current Medications (08/18/2017):  This is the current hospital active medication list Current Facility-Administered Medications  Medication Dose Route Frequency Provider Last Rate Last Dose  .  docusate sodium (COLACE) capsule 100 mg  100 mg Oral BID Clapacs, John T, MD   100 mg at 08/18/17 0900  . donepezil (ARICEPT) tablet 10 mg  10 mg Oral QHS Clapacs, John T, MD      . escitalopram (LEXAPRO) tablet 5 mg  5 mg Oral Daily Clapacs, Jackquline Denmark, MD   5 mg at 08/18/17 0901  . finasteride (PROSCAR) tablet 5 mg  5 mg Oral Daily Clapacs, Jackquline Denmark, MD   5 mg at 08/18/17 0903  . lisinopril (PRINIVIL,ZESTRIL) tablet 10 mg  10 mg Oral Daily Clapacs, Jackquline Denmark, MD   10 mg at 08/18/17 0903  . memantine (NAMENDA) tablet 10 mg  10 mg Oral BID Clapacs, Jackquline Denmark, MD   10 mg at 08/18/17 0903  . QUEtiapine (SEROQUEL) tablet 25 mg  25 mg Oral BID Clapacs, Jackquline Denmark, MD   25 mg at 08/18/17 0905  . QUEtiapine (SEROQUEL) tablet 50 mg  50 mg Oral QHS Clapacs, John T, MD      . senna (SENOKOT) tablet 8.6 mg  1 tablet Oral QHS Clapacs, John T, MD      . simvastatin (ZOCOR) tablet 10 mg  10 mg Oral q1800 Clapacs, Jackquline Denmark, MD       Current Outpatient Medications  Medication Sig Dispense Refill  . bisacodyl (DULCOLAX) 10 MG suppository Place 10 mg rectally daily as needed for moderate constipation.    . Cholecalciferol 10000 units CAPS Take 1 capsule by mouth  daily.    . docusate sodium (COLACE) 100 MG capsule Take 100 mg by mouth 2 (two) times daily.    Marland Kitchen. donepezil (ARICEPT) 10 MG tablet Take 10 mg by mouth at bedtime.    Marland Kitchen. escitalopram (LEXAPRO) 5 MG tablet Take 5 mg by mouth daily.    . finasteride (PROSCAR) 5 MG tablet Take 5 mg by mouth daily.    Marland Kitchen. lisinopril (PRINIVIL,ZESTRIL) 10 MG tablet Take 10 mg by mouth daily.    . memantine (NAMENDA) 10 MG tablet Take 10 mg by mouth 2 (two) times daily.    Marland Kitchen. senna (SENOKOT) 8.6 MG tablet Take 1 tablet by mouth at bedtime.    . simvastatin (ZOCOR) 10 MG tablet Take 10 mg by mouth at bedtime.    . tamsulosin (FLOMAX) 0.4 MG CAPS capsule Take 0.4 mg by mouth every evening.    . TUBERCULIN PPD ID Inject into the skin. NEXT DOSE DUE January 15 3pm-11pm for step 2 dose.    Marland Kitchen.  QUEtiapine (SEROQUEL) 50 MG tablet Take 1 tablet (50 mg total) by mouth 2 (two) times daily. 60 tablet 1     Discharge Medications: Please see discharge summary for a list of discharge medications.  Relevant Imaging Results:  Relevant Lab Results:   Additional Information SSN: [id number]  Dominic PeaJeneya G Jontue Crumpacker, LCSW

## 2017-08-18 NOTE — ED Notes (Signed)
Charge RN Remains at bedside until sitter arrives.

## 2017-08-18 NOTE — ED Notes (Signed)
Spoke with sister, Mackey Birchwoodaulette whom [facility]. Pau[facility] and [facility] VJ Vijayaprazaha Ramanujam [phone]. Transfer coordinator [phone].   Paulette [phone]

## 2017-08-18 NOTE — ED Notes (Signed)
Pt changed, pt now clean and dry.

## 2017-08-18 NOTE — Clinical Social Work Note (Signed)
CSW received a voicemail from [employer] at "[facility] [place] [phone] or [phone]) inquiring about pt returning to group home.  CSW met with pt's sister-Jaime Miller in the family consultation room at her request. Ms. Jaime Miller wanted a copy of pt's FL-2 and CSW informed her CSW cannot print that out and give to her, but can fax to facilities for admission review. CSW and Ms. Miller called Scot Jun on speaker phone. Ms. Jaime Miller asked CSW if CSW would be making a report to the state for not accepting the pt back and CSW confirmed. Ms. Jaime Miller stated she did issue a 15 day notice to the patient, but it did not expire until the end of the month. CSW stated the facility would be expected to allow pt to stay for the duration of that 15 days. Ms. Jaime Miller stated that she is wiling to accept pt back. However, Ms. Jaime Miller was considering taking pt to her home or having him placed at another ALF. CSW agreed to send out referrals to the following memory care ALFs: [facility], [facility], [place].   CSW received another visit from Ms. Miller accompanied by Ms. Ms. Jaime Miller stating she was informed that pt can be transferred to the [facility] for evaluation. CSW called [phone] (in front of Ms. Miller and Ms. Jaime Miller) and spoke with Jaime Miller in an attempt to verify this information. Jaime Miller stated pt has a mental health appointment scheduled for 1/25 at the [facility] in [place], but did not see any other information about a transfer to the hospital. Per [place]. Miller spoke with had left for the day and the doctor was not available. Ms. Jaime Miller stated she would accept pt back into the group home today and Ms. Miller stated she would take pt into her home. CSW provided Ms. Moore with updates on the ALFs CSW contacted. CSW updated EDP and RN. Pt to discharge today. CSW signing off as no further Social Work needs identified.  Oretha Ellis,  Latanya Presser, [place] Social Worker-ED [phone]

## 2017-08-18 NOTE — ED Notes (Signed)
Pt assisted to the use of urinal. Pt's sheets changed.

## 2017-08-18 NOTE — ED Notes (Signed)
BEHAVIORAL HEALTH ROUNDING Patient sleeping: No. Patient alert and oriented: yes Behavior appropriate: Yes.  ; If no, describe:  Nutrition and fluids offered: yes Toileting and hygiene offered: Yes  Sitter present: q15 minute observations and security  monitoring Law enforcement present: Yes  ODS  

## 2017-08-18 NOTE — ED Notes (Signed)
Pt given breakfast tray and beverage.  

## 2017-08-18 NOTE — ED Notes (Signed)
Patient refused vsigns,notified nurse Amy T.

## 2017-08-18 NOTE — ED Notes (Signed)
ED Is the patient under IVC or is there intent for IVC: Yes.   Is the patient medically cleared: Yes.   Is there vacancy in the ED BHU: Yes.   Is the population mix appropriate for patient:  Geriatric   Is the patient awaiting placement in inpatient or outpatient setting: Yes.   Has the patient had a psychiatric consult:  Consult pending    Survey of unit performed for contraband, proper placement and condition of furniture, tampering with fixtures in bathroom, shower, and each patient room: Yes.  ; Findings:  APPEARANCE/BEHAVIOR Confused - cooperative  NEURO ASSESSMENT Orientation: oriented to self    Denies pain Hallucinations: No.None noted (Hallucinations) Speech: Normal Gait:  unsteady  RESPIRATORY ASSESSMENT Even  Unlabored respirations  CARDIOVASCULAR ASSESSMENT Pulses equal   regular rate  Skin warm and dry   GASTROINTESTINAL ASSESSMENT no GI complaint EXTREMITIES Full ROM  PLAN OF CARE Provide calm/safe environment. Vital signs assessed twice daily. ED BHU Assessment once each 12-hour shift. Collaborate with TTS daily or as condition indicates. Assure the ED provider has rounded once each shift. Provide and encourage hygiene. Provide redirection as needed. Assess for escalating behavior; address immediately and inform ED provider.  Assess family dynamic and appropriateness for visitation as needed: Yes.  ; If necessary, describe findings:  Educate the patient/family about BHU procedures/visitation: Yes.  ; If necessary, describe findings:

## 2017-08-18 NOTE — ED Provider Notes (Signed)
-----------------------------------------   8:28 AM on 08/18/2017 -----------------------------------------   Blood pressure 127/75, pulse (!) 50, temperature 97.9 F (36.6 C), temperature source Oral, resp. rate 16, height 5\' 10"  (1.778 m), weight 81.6 kg (180 lb), SpO2 100 %.  The patient had no acute events since last update.  Calm and cooperative at this time.  Disposition is pending Psychiatry/Behavioral Medicine team recommendations.     Rockne MenghiniNorman, Anne-Caroline, MD 08/18/17 [phone]

## 2017-08-18 NOTE — Discharge Instructions (Signed)
patient has been doing well for the last 2 days or so here in the emergency room. We will let him go. Please follow-up with his regular doctor. Continue all of his medications. 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

## 2017-08-18 NOTE — ED Notes (Signed)
[facility] with [facility].

## 2017-08-18 NOTE — ED Notes (Signed)
Pt becoming agitated and restless. Charge RN made aware that we needed a physical sitter rather than a Soil scientisttele sitter.

## 2017-08-18 NOTE — ED Provider Notes (Signed)
I should add that the group home is now saying that they will take him back.   Arnaldo NatalMalinda, Vitalia Stough F, MD 08/18/17 [phone]

## 2017-08-18 NOTE — ED Provider Notes (Signed)
patient has been stable in the emergency room plan now is to discharge him back to the group home they will follow up with outpatient medical care and if he worsens return him here or go to the [facility]. He has been stable here.   Arnaldo NatalMalinda, Paul F, MD 08/18/17 [phone]

## 2017-08-21 ENCOUNTER — Emergency Department
Admission: EM | Admit: 2017-08-21 | Discharge: 2017-08-21 | Discharge status: Against Medical Advice | Payer: Medicare (Managed Care) | Attending: Emergency Medicine | Admitting: Emergency Medicine

## 2017-08-21 ENCOUNTER — Emergency Department: Payer: Medicare (Managed Care)

## 2017-08-21 DIAGNOSIS — E119 Type 2 diabetes mellitus without complications: Secondary | ICD-10-CM | POA: Diagnosis not present

## 2017-08-21 DIAGNOSIS — Z79899 Other long term (current) drug therapy: Secondary | ICD-10-CM | POA: Diagnosis not present

## 2017-08-21 DIAGNOSIS — I10 Essential (primary) hypertension: Secondary | ICD-10-CM | POA: Insufficient documentation

## 2017-08-21 DIAGNOSIS — G309 Alzheimer's disease, unspecified: Secondary | ICD-10-CM | POA: Diagnosis not present

## 2017-08-21 DIAGNOSIS — Z87820 Personal history of traumatic brain injury: Secondary | ICD-10-CM | POA: Insufficient documentation

## 2017-08-21 DIAGNOSIS — K5900 Constipation, unspecified: Secondary | ICD-10-CM | POA: Diagnosis not present

## 2017-08-21 MED ORDER — ZIPRASIDONE MESYLATE 20 MG IM SOLR
INTRAMUSCULAR | Status: AC
  Administered 2017-08-21: 10 mg via INTRAMUSCULAR
  Filled 2017-08-21: qty 20

## 2017-08-21 MED ORDER — ZIPRASIDONE MESYLATE 20 MG IM SOLR
10.0000 mg | Freq: Once | INTRAMUSCULAR | Status: AC
  Administered 2017-08-21: 10 mg via INTRAMUSCULAR

## 2017-08-21 NOTE — ED Notes (Signed)
Pt family states pt has not used bathroom in 12 days. Pt has hx of constipation. Pt has had a small movement today. Pt has 2 fleet enemas today along with pres meds that are to treat constipation, and miralax. Pt is still not using. Pt is altered mental status family is at bedside. Awaiting EDP.

## 2017-08-21 NOTE — ED Provider Notes (Addendum)
----------------------------------------- 7:22 PM on 08/21/2017 -----------------------------------------  [facility] Emergency Department Provider Note  ____________________________________________   I have reviewed the triage vital signs and the nursing notes. Where available I have reviewed prior notes and, if possible and indicated, outside hospital notes.    HISTORY  Chief Complaint Constipation    HPI Jaime Miller is a 72 y.o. male a history of significant constipation.  History is per sister, patient is at baseline demented, cannot give a history.  Patient has apparently not had much in the way of bowel movements in the last couple days.  There have been some small bowel movements.  He is not vomiting has been tolerating p.o. well is not complaining of pain.  He is at his otherwise normal baseline.  However they are concerned that he has been constipated.  Patient did have a change in his chronic constipation medications and apparently there was some delay in getting them filled, Level 5 chart caveat; no further history available due to patient status.  Past Medical History:  Diagnosis Date  . Alzheimer disease   . Bipolar I disorder, most recent episode (or current) depressed, severe, specified as with psychotic behavior (HCC)   . BPH without obstruction/lower urinary tract symptoms   . Dementia   . Diabetes mellitus without complication (HCC)   . Gout   . Hyperlipidemia   . Hypertension   . Major depressive disorder   . Paranoid schizophrenia (HCC)   . TBI (traumatic brain injury) (HCC)   . Vitamin D deficiency     There are no active problems to display for this patient.   History reviewed. No pertinent surgical history.  Prior to Admission medications   Medication Sig Start Date End Date Taking? Authorizing Provider  bisacodyl (DULCOLAX) 10 MG suppository Place 10 mg rectally daily as needed for moderate constipation.    [provider]  Cholecalciferol 10000 units CAPS Take 1 capsule by mouth daily.    [provider]  docusate sodium (COLACE) 100 MG capsule Take 100 mg by mouth 2 (two) times daily.    [provider]  donepezil (ARICEPT) 10 MG tablet Take 10 mg by mouth at bedtime.    [provider]  escitalopram (LEXAPRO) 5 MG tablet Take 5 mg by mouth daily.    [provider]  finasteride (PROSCAR) 5 MG tablet Take 5 mg by mouth daily.    [provider]  lisinopril (PRINIVIL,ZESTRIL) 10 MG tablet Take 10 mg by mouth daily.    [provider]  memantine (NAMENDA) 10 MG tablet Take 10 mg by mouth 2 (two) times daily.    [provider]  QUEtiapine (SEROQUEL) 50 MG tablet Take 1 tablet (50 mg total) by mouth 2 (two) times daily. 08/17/17   Arnaldo Natal, MD  senna (SENOKOT) 8.6 MG tablet Take 1 tablet by mouth at bedtime.    [provider]  simvastatin (ZOCOR) 10 MG tablet Take 10 mg by mouth at bedtime.    [provider]  tamsulosin (FLOMAX) 0.4 MG CAPS capsule Take 0.4 mg by mouth every evening.    [provider]  TUBERCULIN PPD ID Inject into the skin. NEXT DOSE DUE January 15 3pm-11pm for step 2 dose. 08/18/17   [provider]    Allergies Etodolac  No family history on file.  Social History Social History   Tobacco Use  . Smoking status: Never Smoker  . Smokeless tobacco: Never Used  Substance  Use Topics  . Alcohol use: No    Frequency: Never  . Drug use: No    Review of Systems Level 5 chart caveat; no further history available due to patient status.   ____________________________________________   PHYSICAL EXAM:  VITAL SIGNS: ED Triage Vitals  Enc Vitals Group     BP 08/21/17 1545 (!) 152/92     Pulse Rate 08/21/17 1545 93     Resp 08/21/17 1545 18     Temp 08/21/17 1545 99.5 F (37.5 C)     Temp Source 08/21/17 1545 Axillary     SpO2 08/21/17 1545 94 %     Weight  08/21/17 1546 180 lb (81.6 kg)     Height --      Head Circumference --      Peak Flow --      Pain Score --      Pain Loc --      Pain Edu? --      Excl. in GC? --     Constitutional: Alert and fully demented no acute distress Eyes: Conjunctivae are normal Head: Atraumatic HEENT: No congestion/rhinnorhea. Mucous membranes are moist.  Oropharynx non-erythematous Neck:   Nontender with no meningismus, no masses, no stridor Cardiovascular: Normal rate, regular rhythm. Grossly normal heart sounds.  Good peripheral circulation. Respiratory: Normal respiratory effort.  No retractions. Lungs CTAB. Abdominal: Soft and nontender. No distention. No guarding no rebound Back:  There is no focal tenderness or step off.  there is no midline tenderness there are no lesions noted. there is no CVA tenderness Exam: There is brown stool in the vault I do not feel a large impaction at least to the extent that I can detect.  Patient has had enemas already today and there is some loose stool noted Musculoskeletal: No lower extremity tenderness, no upper extremity tenderness. No joint effusions, no DVT signs strong distal pulses no edema Neurologic:  Normal speech and language. No gross focal neurologic deficits are appreciated.  Skin:  Skin is warm, dry and intact. No rash noted. Psychiatric: Mood and affect are normal. Speech and behavior are normal.  ____________________________________________   LABS (all labs ordered are listed, but only abnormal results are displayed)  Labs Reviewed - No data to display  Pertinent labs  results that were available during my care of the patient were reviewed by me and considered in my medical decision making (see chart for details). ____________________________________________  EKG  I personally interpreted any EKGs ordered by me or triage  ____________________________________________  RADIOLOGY  Pertinent labs & imaging results that were available during  my care of the patient were reviewed by me and considered in my medical decision making (see chart for details). If possible, patient and/or family made aware of any abnormal findings.  Dg Abdomen Acute W/chest  Result Date: 08/21/2017 CLINICAL DATA:  Dementia, no bowel movement for 13 days. EXAM: DG ABDOMEN ACUTE W/ 1V CHEST COMPARISON:  CT abdomen and pelvis July 03, 2017 FINDINGS: Cardiomediastinal silhouette is normal. Lungs are clear, no pleural effusions. No pneumothorax. Soft tissue planes and included osseous structures are unremarkable. Bowel gas pattern is nondilated and nonobstructive. Mild stool distended rectum no significant volume large bowel stool. Rounded calcification RIGHT pelvis seen in distal RIGHT ureter versus bladder wall on prior CT. Calcification projecting iliac bone, possible enteric contents. Phleboliths in the pelvis. No intra-abdominal mass effect, or free air. Soft tissue planes and included osseous structures are non-suspicious. IMPRESSION: 1. No acute cardiopulmonary process.  2. Stool distended rectum, normal bowel gas pattern. Electronically Signed   By: Awilda Metro M.D.   On: 08/21/2017 18:59   ____________________________________________    PROCEDURES  Procedure(s) performed: None  Procedures  Critical Care performed: None  ____________________________________________   INITIAL IMPRESSION / ASSESSMENT AND PLAN / ED COURSE  Pertinent labs & imaging results that were available during my care of the patient were reviewed by me and considered in my medical decision making (see chart for details).  She with chronic constipation presents today with constipation after having some changes in his medication.  He is back on a good regimen but the family is worried that he has not had satisfying bowel movements over the last several days.  Patient himself cannot give a history and is otherwise his baseline.  Obviously there is a broad differential for this  but at this time it does appear to be only constipation.  I did do an x-ray which shows stool in the vault which I cannot quite reach.  No evidence of perforation or other symptoms, and patient's exam is quite reassuring.  I have considered AAA, obstruction, mass etc. but at this time it does appear to be only constipation.  We will try an enema and see how he does.  Family very comfortable with this plan.  ----------------------------------------- 8:01 PM on 08/21/2017 -----------------------------------------  Patient has a history of getting agitated and upset around this time of night and the family states he starting to act up, patient is very agitated he is refusing to let us do an enema, he will not let us touch him, my concern is if I send the patient home he will not be able to have a bowel movement as he is allegedly not had one for some time although I think given that he was in the hospital for other reasons a few days ago her ability to determine exactly how long is been since he had a bowel movement is not clear.  Given his reluctance to let us try an enema I will obtain a CT scan to make sure nothing else is going on we will check basic blood work, patient is refusing that however so we will try giving him some mild sedation which she has had before to see if we can further evaluate him for his constipation   ----------------------------------------- 9:45 PM on 08/21/2017 -----------------------------------------  Patient was much more calm and intractable after Geodon he was not somnolent did not appeared to be in danger of falling over his baseline, however, family and and legal guardian, sister, elected to take him home instead.  She declined further care or treatment she states she feels that an enema at home will be better tolerated by the patient.  I do not see any acute reason to fight them on this but again we were more than willing to do our best to try to get the patient moving  his bowels here.  They do have extensive treatments at home and as noted above, they had not been on medication for some time and only recently restarted them.  Family understands the risks benefits and alternatives of staying and going and they understand they can come back at any time if they change their mind.  The does not appear to be any evidence that the medication we gave him is causing him to be dangerous for discharge and as they refused further care and are adamant that they would go home we will let them  go have advised them that they are leaving essentially AGAINST MEDICAL ADVICE although at this time I do not see any reason to suspect the patient has other pathologies besides constipation going on to have not been able to fully determine this.   ____________________________________________   FINAL CLINICAL IMPRESSION(S) / ED DIAGNOSES  Final diagnoses:  None      This chart was dictated using voice recognition software.  Despite best efforts to proofread,  errors can occur which can change meaning.      Jeanmarie Plant, MD 08/21/17 1926    Jeanmarie Plant, MD 08/21/17 2002    Jeanmarie Plant, MD 08/21/17 2147

## 2017-08-21 NOTE — ED Notes (Signed)
ED Provider at bedside. 

## 2017-08-21 NOTE — ED Notes (Signed)
Pt very agitated and uncooperative with peri-care following a very small amount of bowel incontinence. Unable to perform soap suds enema as ordered; Dr Alphonzo LemmingsMcShane made aware.

## 2017-08-21 NOTE — ED Notes (Signed)
Pt's sister/caregiver is requesting to take pt home at this time. Caregiver informed that she would need to sign out pt AMA and verbalized understanding. Caregiver stated she believes the pt would be more comfortable, relaxed, and less agitated at home in more familiar surroundings d/t pt's issues with dementia. Dr Alphonzo LemmingsMcShane made aware and pt to be d/c'd AMA. Caregiver informed that pt can be brought back immediately if symptoms do not resolve, worsen, or to be evaluated for new or other concerning issues.

## 2017-08-21 NOTE — ED Notes (Signed)
Pt increasingly agitated and requiring 2 staff and 1 family member to keep pt in ED stretcher. Toma CopierSherie, RN made Dr Alphonzo LemmingsMcShane aware and order for 10mg  Geodon received.

## 2017-08-21 NOTE — ED Triage Notes (Signed)
Per sister, pt has dementia and recently moved in with her 16 days ago.  Per sister pt has not had any BM in 13 days.  Sister states she gave 2 fleets enemas today and his miralax and stool softeners with no results.  Pt is very uncomfortable in triage.  Pt does not follow directions well.  Sister is primary caregiver.

## 2017-09-30 ENCOUNTER — Other Ambulatory Visit: Payer: Self-pay

## 2017-09-30 ENCOUNTER — Emergency Department
Admission: EM | Admit: 2017-09-30 | Discharge: 2017-09-30 | Disposition: A | Discharge status: Home / Self Care | Payer: Medicare (Managed Care) | Attending: Student in an Organized Health Care Education/Training Program | Admitting: Student in an Organized Health Care Education/Training Program

## 2017-09-30 DIAGNOSIS — E119 Type 2 diabetes mellitus without complications: Secondary | ICD-10-CM | POA: Insufficient documentation

## 2017-09-30 DIAGNOSIS — Z79899 Other long term (current) drug therapy: Secondary | ICD-10-CM | POA: Insufficient documentation

## 2017-09-30 DIAGNOSIS — G309 Alzheimer's disease, unspecified: Secondary | ICD-10-CM | POA: Insufficient documentation

## 2017-09-30 DIAGNOSIS — F319 Bipolar disorder, unspecified: Secondary | ICD-10-CM | POA: Insufficient documentation

## 2017-09-30 DIAGNOSIS — R4182 Altered mental status, unspecified: Secondary | ICD-10-CM | POA: Diagnosis present

## 2017-09-30 DIAGNOSIS — F0281 Dementia in other diseases classified elsewhere with behavioral disturbance: Secondary | ICD-10-CM | POA: Diagnosis not present

## 2017-09-30 DIAGNOSIS — F0391 Unspecified dementia with behavioral disturbance: Secondary | ICD-10-CM

## 2017-09-30 DIAGNOSIS — I10 Essential (primary) hypertension: Secondary | ICD-10-CM | POA: Insufficient documentation

## 2017-09-30 LAB — CBC
HCT: 33.8 % — ABNORMAL LOW (ref 40.0–52.0)
HEMOGLOBIN: 11.1 g/dL — AB (ref 13.0–18.0)
MCH: 28.8 pg (ref 26.0–34.0)
MCHC: 32.9 g/dL (ref 32.0–36.0)
MCV: 87.4 fL (ref 80.0–100.0)
Platelets: 254 10*3/uL (ref 150–440)
RBC: 3.87 MIL/uL — ABNORMAL LOW (ref 4.40–5.90)
RDW: 14.2 % (ref 11.5–14.5)
WBC: 5.2 10*3/uL (ref 3.8–10.6)

## 2017-09-30 LAB — COMPREHENSIVE METABOLIC PANEL
ALBUMIN: 4.3 g/dL (ref 3.5–5.0)
ALT: 11 U/L — AB (ref 17–63)
AST: 28 U/L (ref 15–41)
Alkaline Phosphatase: 66 U/L (ref 38–126)
Anion gap: 10 (ref 5–15)
BUN: 16 mg/dL (ref 6–20)
CHLORIDE: 105 mmol/L (ref 101–111)
CO2: 23 mmol/L (ref 22–32)
CREATININE: 1.18 mg/dL (ref 0.61–1.24)
Calcium: 9 mg/dL (ref 8.9–10.3)
GFR calc Af Amer: >60 mL/min (ref >60)
GFR calc non Af Amer: 59 mL/min — ABNORMAL LOW (ref >60)
GLUCOSE: 118 mg/dL — AB (ref 65–99)
Potassium: 3.3 mmol/L — ABNORMAL LOW (ref 3.5–5.1)
SODIUM: 138 mmol/L (ref 135–145)
Total Bilirubin: 0.7 mg/dL (ref 0.3–1.2)
Total Protein: 7.5 g/dL (ref 6.5–8.1)

## 2017-09-30 LAB — ETHANOL: Alcohol, Ethyl (B): <10 mg/dL (ref <10)

## 2017-09-30 LAB — ACETAMINOPHEN LEVEL: Acetaminophen (Tylenol), Serum: <10 ug/mL — ABNORMAL LOW (ref 10–30)

## 2017-09-30 LAB — SALICYLATE LEVEL: Salicylate Lvl: <7 mg/dL (ref 2.8–30.0)

## 2017-09-30 MED ORDER — HALOPERIDOL LACTATE 5 MG/ML IJ SOLN
5.0000 mg | Freq: Once | INTRAMUSCULAR | Status: DC
  Filled 2017-09-30: qty 1

## 2017-09-30 MED ORDER — HALOPERIDOL LACTATE 5 MG/ML IJ SOLN
5.0000 mg | Freq: Once | INTRAMUSCULAR | Status: AC
  Administered 2017-09-30: 5 mg via INTRAMUSCULAR
  Filled 2017-09-30: qty 1

## 2017-09-30 MED ORDER — QUETIAPINE FUMARATE 25 MG PO TABS
ORAL_TABLET | ORAL | Status: AC
  Filled 2017-09-30: qty 4

## 2017-09-30 MED ORDER — QUETIAPINE FUMARATE 25 MG PO TABS
100.0000 mg | ORAL_TABLET | Freq: Every day | ORAL | Status: DC
  Administered 2017-09-30: 100 mg via ORAL

## 2017-09-30 NOTE — ED Triage Notes (Signed)
Pt sister Jaime Miller brought pt to ED today d/t dementia. Pt hasn't been taking night time medications x 3 days. She states he doesn't know who is he or where he is. States he has been more aggressive at home.   Pt denies pain. Pt cooperative in triage. Sitting in wheelchair.

## 2017-09-30 NOTE — ED Provider Notes (Signed)
[facility] Emergency Department Provider Note    First MD Initiated Contact with Patient 09/30/17 1802     (approximate)  I have reviewed the triage vital signs and the nursing notes.   HISTORY  Chief Complaint No chief complaint on file.  Level V Caveat:  dementia  HPI Zakari Bathe is a 72 y.o. male with a history of Alzheimer's as well as paranoid schizophrenia presents with his sister who is his primary caretaker due to lack of sleep and not taking his medications at home for the past 3 days.  Patient disoriented today more agitated and intermittently aggressive with family.  Patient repeatedly staying "let me get my stuff ".  No reported trauma.  No fevers.  Sister at bedside states that he frequently does this and she thinks that his symptoms became worse today because he was staying with another caretaker so that she could leave the house.  Past Medical History:  Diagnosis Date  . Alzheimer disease   . Bipolar I disorder, most recent episode (or current) depressed, severe, specified as with psychotic behavior (HCC)   . BPH without obstruction/lower urinary tract symptoms   . Dementia   . Diabetes mellitus without complication (HCC)   . Gout   . Hyperlipidemia   . Hypertension   . Major depressive disorder   . Paranoid schizophrenia (HCC)   . TBI (traumatic brain injury) (HCC)   . Vitamin D deficiency    History reviewed. No pertinent family history. History reviewed. No pertinent surgical history. There are no active problems to display for this patient.     Prior to Admission medications   Medication Sig Start Date End Date Taking? Authorizing Provider  bisacodyl (DULCOLAX) 10 MG suppository Place 10 mg rectally daily as needed for moderate constipation.    [provider]  Cholecalciferol 10000 units CAPS Take 1 capsule by mouth daily.    [provider]  docusate sodium (COLACE) 100 MG capsule Take 100 mg by mouth  2 (two) times daily.    [provider]  donepezil (ARICEPT) 10 MG tablet Take 10 mg by mouth at bedtime.    [provider]  escitalopram (LEXAPRO) 5 MG tablet Take 5 mg by mouth daily.    [provider]  finasteride (PROSCAR) 5 MG tablet Take 5 mg by mouth daily.    [provider]  lisinopril (PRINIVIL,ZESTRIL) 10 MG tablet Take 10 mg by mouth daily.    [provider]  lisinopril-hydrochlorothiazide (PRINZIDE,ZESTORETIC) 10-12.5 MG tablet Take 1 tablet by mouth daily. 07/30/17   [provider]  memantine (NAMENDA) 10 MG tablet Take 10 mg by mouth 2 (two) times daily.    [provider]  QUEtiapine (SEROQUEL) 50 MG tablet Take 1 tablet (50 mg total) by mouth 2 (two) times daily. 08/17/17   Arnaldo Natal, MD  senna (SENOKOT) 8.6 MG tablet Take 1 tablet by mouth at bedtime.    [provider]  simvastatin (ZOCOR) 10 MG tablet Take 10 mg by mouth at bedtime.    [provider]  tamsulosin (FLOMAX) 0.4 MG CAPS capsule Take 0.4 mg by mouth every evening.    [provider]  TUBERCULIN PPD ID Inject into the skin. NEXT DOSE DUE January 15 3pm-11pm for step 2 dose. 08/18/17   [provider]    Allergies Etodolac    Social History Social History   Tobacco Use  . Smoking status: Never Smoker  . Smokeless  tobacco: Never Used  Substance Use Topics  . Alcohol use: No    Frequency: Never  . Drug use: No    Review of Systems Patient denies headaches, rhinorrhea, blurry vision, numbness, shortness of breath, chest pain, edema, cough, abdominal pain, nausea, vomiting, diarrhea, dysuria, fevers, rashes or hallucinations unless otherwise stated above in HPI. ____________________________________________   PHYSICAL EXAM:  VITAL SIGNS: Vitals:   09/30/17 1647 09/30/17 1648  BP:    Pulse: 89   Resp:    Temp:  98 F (36.7 C)  SpO2: 94%     Constitutional: Alert, pacing about room in no  acute distress. Eyes: Conjunctivae are normal.  Head: Atraumatic. Nose: No congestion/rhinnorhea. Mouth/Throat: Mucous membranes are moist.   Neck: No stridor. Painless ROM.  Cardiovascular: Normal rate, regular rhythm. Grossly normal heart sounds.  Good peripheral circulation. Respiratory: Normal respiratory effort.  No retractions. Lungs CTAB. Gastrointestinal: Soft and nontender. No distention. No abdominal bruits. No CVA tenderness. Genitourinary:  Musculoskeletal: No lower extremity tenderness nor edema.  No joint effusions. Neurologic:  Normal speech and language. No gross focal neurologic deficits are appreciated. No facial droop Skin:  Skin is warm, dry and intact. No rash noted. Psychiatric: disorganized but redirectable. ____________________________________________   LABS (all labs ordered are listed, but only abnormal results are displayed)  Results for orders placed or performed during the hospital encounter of 09/30/17 (from the past 24 hour(s))  Comprehensive metabolic panel     Status: Abnormal   Collection Time: 09/30/17  4:55 PM  Result Value Ref Range   Sodium 138 135 - 145 mmol/L   Potassium 3.3 (L) 3.5 - 5.1 mmol/L   Chloride 105 101 - 111 mmol/L   CO2 23 22 - 32 mmol/L   Glucose, Bld 118 (H) 65 - 99 mg/dL   BUN 16 6 - 20 mg/dL   Creatinine, Ser 8.11 0.61 - 1.24 mg/dL   Calcium 9.0 8.9 - 91.4 mg/dL   Total Protein 7.5 6.5 - 8.1 g/dL   Albumin 4.3 3.5 - 5.0 g/dL   AST 28 15 - 41 U/L   ALT 11 (L) 17 - 63 U/L   Alkaline Phosphatase 66 38 - 126 U/L   Total Bilirubin 0.7 0.3 - 1.2 mg/dL   GFR calc non Af Amer 59 (L) >60 mL/min   GFR calc Af Amer >60 >60 mL/min   Anion gap 10 5 - 15  Ethanol     Status: None   Collection Time: 09/30/17  4:55 PM  Result Value Ref Range   Alcohol, Ethyl (B) <10 <10 mg/dL  Salicylate level     Status: None   Collection Time: 09/30/17  4:55 PM  Result Value Ref Range   Salicylate Lvl <7.0 2.8 - 30.0 mg/dL  Acetaminophen level      Status: Abnormal   Collection Time: 09/30/17  4:55 PM  Result Value Ref Range   Acetaminophen (Tylenol), Serum <10 (L) 10 - 30 ug/mL  cbc     Status: Abnormal   Collection Time: 09/30/17  4:55 PM  Result Value Ref Range   WBC 5.2 3.8 - 10.6 K/uL   RBC 3.87 (L) 4.40 - 5.90 MIL/uL   Hemoglobin 11.1 (L) 13.0 - 18.0 g/dL   HCT 78.2 (L) 95.6 - 21.3 %   MCV 87.4 80.0 - 100.0 fL   MCH 28.8 26.0 - 34.0 pg   MCHC 32.9 32.0 - 36.0 g/dL   RDW 08.6 57.8 - 46.9 %   Platelets 254  150 - 440 K/uL   ____________________________________________   ____________________________________________  RADIOLOGY   ____________________________________________   PROCEDURES  Procedure(s) performed:  Procedures    Critical Care performed: no ____________________________________________   INITIAL IMPRESSION / ASSESSMENT AND PLAN / ED COURSE  Pertinent labs & imaging results that were available during my care of the patient were reviewed by me and considered in my medical decision making (see chart for details).  DDX: Psychosis, delirium, medication effect, noncompliance, polysubstance abuse, Si, Hi, depression   Leverne Humblesrthur Lee Fallaw is a 72 y.o. who presents to the ED with history of dementia presents not taking his medications.  Blood work is reassuring.  Patient able to ambulate with steady gait.  Does not appear to be having worsening dementia symptoms does appears disorganized but not paranoid and not agitated.  Is not taking his medications he was given a dose of IM Haldol and given Seroquel mixed in ice cream.  Patient tolerated this.  Much more calm.  Sister who is his caregiver would like to take him home at this point.  Discussed observation in the ER but she would prefer him to be home where he is more comfortable.  At this point do believe patient stable and appropriate for discharge home.      ____________________________________________   FINAL CLINICAL IMPRESSION(S) / ED  DIAGNOSES  Final diagnoses:  Dementia with behavioral disturbance, unspecified dementia type      NEW MEDICATIONS STARTED DURING THIS VISIT:  New Prescriptions   No medications on file     Note:  This document was prepared using Dragon voice recognition software and may include unintentional dictation errors.    Willy Eddyobinson, Genean Adamski, MD 09/30/17 [phone]

## 2017-09-30 NOTE — ED Notes (Signed)
Informed RN that patient has been roomed and is ready for evaluation.  Patient in NAD at this time and call bell placed within reach.   

## 2017-09-30 NOTE — Discharge Instructions (Signed)
Please follow-up with your neurologist.  Return to the ER for any questions or concerns.  It does seem that mixing Mr. Jaime Miller medications with ice cream is very effective.
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
## 2017-09-30 NOTE — ED Notes (Signed)
Pt given water and sandwich tray with MD permission.

## 2017-09-30 NOTE — ED Notes (Signed)
Jaime Joinerebecca, RN crushed Seroquel and placed in ice cream. Pt took medication without issue.
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
## 2017-10-17 DIAGNOSIS — E119 Type 2 diabetes mellitus without complications: Secondary | ICD-10-CM | POA: Diagnosis not present

## 2017-10-17 DIAGNOSIS — F200 Paranoid schizophrenia: Secondary | ICD-10-CM | POA: Diagnosis not present

## 2017-10-17 DIAGNOSIS — F319 Bipolar disorder, unspecified: Secondary | ICD-10-CM | POA: Insufficient documentation

## 2017-10-17 DIAGNOSIS — F0281 Dementia in other diseases classified elsewhere with behavioral disturbance: Secondary | ICD-10-CM | POA: Insufficient documentation

## 2017-10-17 DIAGNOSIS — F315 Bipolar disorder, current episode depressed, severe, with psychotic features: Secondary | ICD-10-CM | POA: Diagnosis not present

## 2017-10-17 DIAGNOSIS — I10 Essential (primary) hypertension: Secondary | ICD-10-CM | POA: Insufficient documentation

## 2017-10-17 DIAGNOSIS — G309 Alzheimer's disease, unspecified: Secondary | ICD-10-CM | POA: Insufficient documentation

## 2017-10-17 DIAGNOSIS — F329 Major depressive disorder, single episode, unspecified: Secondary | ICD-10-CM | POA: Insufficient documentation

## 2017-10-17 DIAGNOSIS — Z79899 Other long term (current) drug therapy: Secondary | ICD-10-CM | POA: Diagnosis not present

## 2017-10-17 DIAGNOSIS — S299XXA Unspecified injury of thorax, initial encounter: Secondary | ICD-10-CM | POA: Diagnosis not present

## 2017-10-18 ENCOUNTER — Emergency Department
Admission: EM | Admit: 2017-10-18 | Discharge: 2017-10-23 | Disposition: A | Discharge status: Other Acute Inpatient Hospital | Payer: 59 | Attending: Emergency Medicine | Admitting: Emergency Medicine

## 2017-10-18 ENCOUNTER — Encounter: Payer: Self-pay | Admitting: Emergency Medicine

## 2017-10-18 ENCOUNTER — Other Ambulatory Visit: Payer: Self-pay

## 2017-10-18 DIAGNOSIS — F315 Bipolar disorder, current episode depressed, severe, with psychotic features: Secondary | ICD-10-CM

## 2017-10-18 DIAGNOSIS — R410 Disorientation, unspecified: Secondary | ICD-10-CM

## 2017-10-18 DIAGNOSIS — F0281 Dementia in other diseases classified elsewhere with behavioral disturbance: Secondary | ICD-10-CM

## 2017-10-18 DIAGNOSIS — F02818 Dementia in other diseases classified elsewhere, unspecified severity, with other behavioral disturbance: Secondary | ICD-10-CM

## 2017-10-18 DIAGNOSIS — F0391 Unspecified dementia with behavioral disturbance: Secondary | ICD-10-CM

## 2017-10-18 DIAGNOSIS — G309 Alzheimer's disease, unspecified: Secondary | ICD-10-CM

## 2017-10-18 LAB — COMPREHENSIVE METABOLIC PANEL
ALBUMIN: 3.9 g/dL (ref 3.5–5.0)
ALT: 11 U/L — ABNORMAL LOW (ref 17–63)
ANION GAP: 7 (ref 5–15)
AST: 25 U/L (ref 15–41)
Alkaline Phosphatase: 63 U/L (ref 38–126)
BILIRUBIN TOTAL: 0.4 mg/dL (ref 0.3–1.2)
BUN: 12 mg/dL (ref 6–20)
CHLORIDE: 107 mmol/L (ref 101–111)
CO2: 27 mmol/L (ref 22–32)
Calcium: 8.8 mg/dL — ABNORMAL LOW (ref 8.9–10.3)
Creatinine, Ser: 1.24 mg/dL (ref 0.61–1.24)
GFR calc Af Amer: >60 mL/min (ref >60)
GFR calc non Af Amer: 56 mL/min — ABNORMAL LOW (ref >60)
GLUCOSE: 120 mg/dL — AB (ref 65–99)
POTASSIUM: 3.5 mmol/L (ref 3.5–5.1)
SODIUM: 141 mmol/L (ref 135–145)
TOTAL PROTEIN: 7.1 g/dL (ref 6.5–8.1)

## 2017-10-18 LAB — URINE DRUG SCREEN, QUALITATIVE (ARMC ONLY)
Amphetamines, Ur Screen: NOT DETECTED
BARBITURATES, UR SCREEN: NOT DETECTED
Benzodiazepine, Ur Scrn: NOT DETECTED
CANNABINOID 50 NG, UR ~~LOC~~: NOT DETECTED
COCAINE METABOLITE, UR ~~LOC~~: NOT DETECTED
MDMA (Ecstasy)Ur Screen: NOT DETECTED
Methadone Scn, Ur: NOT DETECTED
Opiate, Ur Screen: NOT DETECTED
Phencyclidine (PCP) Ur S: NOT DETECTED
TRICYCLIC, UR SCREEN: NOT DETECTED

## 2017-10-18 LAB — CBC
HCT: 32 % — ABNORMAL LOW (ref 40.0–52.0)
Hemoglobin: 10.6 g/dL — ABNORMAL LOW (ref 13.0–18.0)
MCH: 28.9 pg (ref 26.0–34.0)
MCHC: 33.1 g/dL (ref 32.0–36.0)
MCV: 87.4 fL (ref 80.0–100.0)
PLATELETS: 227 10*3/uL (ref 150–440)
RBC: 3.66 MIL/uL — ABNORMAL LOW (ref 4.40–5.90)
RDW: 14.5 % (ref 11.5–14.5)
WBC: 4.9 10*3/uL (ref 3.8–10.6)

## 2017-10-18 LAB — ACETAMINOPHEN LEVEL: Acetaminophen (Tylenol), Serum: <10 ug/mL — ABNORMAL LOW (ref 10–30)

## 2017-10-18 LAB — SALICYLATE LEVEL: Salicylate Lvl: <7 mg/dL (ref 2.8–30.0)

## 2017-10-18 LAB — ETHANOL: Alcohol, Ethyl (B): <10 mg/dL (ref <10)

## 2017-10-18 MED ORDER — TAMSULOSIN HCL 0.4 MG PO CAPS
0.4000 mg | ORAL_CAPSULE | Freq: Every evening | ORAL | Status: DC
  Administered 2017-10-18 – 2017-10-22 (×5): 0.4 mg via ORAL
  Filled 2017-10-18 (×5): qty 1

## 2017-10-18 MED ORDER — LORAZEPAM 2 MG/ML IJ SOLN
1.0000 mg | Freq: Once | INTRAMUSCULAR | Status: AC
  Administered 2017-10-19: 1 mg via INTRAMUSCULAR
  Filled 2017-10-18: qty 1

## 2017-10-18 MED ORDER — HYDROCHLOROTHIAZIDE 12.5 MG PO CAPS
12.5000 mg | ORAL_CAPSULE | Freq: Every day | ORAL | Status: DC
  Administered 2017-10-18 – 2017-10-23 (×6): 12.5 mg via ORAL
  Filled 2017-10-18 (×7): qty 1

## 2017-10-18 MED ORDER — QUETIAPINE FUMARATE 25 MG PO TABS
50.0000 mg | ORAL_TABLET | Freq: Two times a day (BID) | ORAL | Status: DC
  Administered 2017-10-18 – 2017-10-19 (×3): 50 mg via ORAL
  Filled 2017-10-18 (×3): qty 2

## 2017-10-18 MED ORDER — MEMANTINE HCL 5 MG PO TABS
10.0000 mg | ORAL_TABLET | Freq: Two times a day (BID) | ORAL | Status: DC
  Administered 2017-10-18 – 2017-10-23 (×11): 10 mg via ORAL
  Filled 2017-10-18 (×11): qty 2

## 2017-10-18 MED ORDER — SIMVASTATIN 10 MG PO TABS
10.0000 mg | ORAL_TABLET | Freq: Every day | ORAL | Status: DC
  Administered 2017-10-18 – 2017-10-22 (×5): 10 mg via ORAL
  Filled 2017-10-18 (×5): qty 1

## 2017-10-18 MED ORDER — FINASTERIDE 5 MG PO TABS
5.0000 mg | ORAL_TABLET | Freq: Every day | ORAL | Status: DC
  Administered 2017-10-18 – 2017-10-23 (×5): 5 mg via ORAL
  Filled 2017-10-18 (×6): qty 1

## 2017-10-18 MED ORDER — DOCUSATE SODIUM 100 MG PO CAPS
100.0000 mg | ORAL_CAPSULE | Freq: Two times a day (BID) | ORAL | Status: DC
  Administered 2017-10-18 – 2017-10-22 (×9): 100 mg via ORAL
  Filled 2017-10-18 (×9): qty 1

## 2017-10-18 MED ORDER — LISINOPRIL-HYDROCHLOROTHIAZIDE 10-12.5 MG PO TABS: 1.0000 | ORAL_TABLET | Freq: Every day | ORAL | Status: DC

## 2017-10-18 MED ORDER — SENNA 8.6 MG PO TABS
1.0000 | ORAL_TABLET | Freq: Every day | ORAL | Status: DC
  Administered 2017-10-18 – 2017-10-21 (×4): 8.6 mg via ORAL
  Filled 2017-10-18 (×5): qty 1

## 2017-10-18 MED ORDER — DONEPEZIL HCL 5 MG PO TABS
10.0000 mg | ORAL_TABLET | Freq: Every day | ORAL | Status: DC
  Administered 2017-10-18 – 2017-10-22 (×5): 10 mg via ORAL
  Filled 2017-10-18 (×5): qty 2

## 2017-10-18 MED ORDER — LISINOPRIL 10 MG PO TABS
10.0000 mg | ORAL_TABLET | Freq: Every day | ORAL | Status: DC
  Administered 2017-10-18 – 2017-10-23 (×5): 10 mg via ORAL
  Filled 2017-10-18 (×6): qty 1

## 2017-10-18 MED ORDER — LISINOPRIL 10 MG PO TABS: 10.0000 mg | ORAL_TABLET | Freq: Every day | ORAL | Status: DC

## 2017-10-18 MED ORDER — ESCITALOPRAM OXALATE 10 MG PO TABS
5.0000 mg | ORAL_TABLET | Freq: Every day | ORAL | Status: DC
  Administered 2017-10-18 – 2017-10-23 (×6): 5 mg via ORAL
  Filled 2017-10-18 (×6): qty 1

## 2017-10-18 NOTE — ED Notes (Signed)
Sister, Jaime Miller, is available for questions from home. Her numbers are cell [phone] and home [phone].

## 2017-10-18 NOTE — ED Notes (Signed)
IVC 
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
## 2017-10-18 NOTE — ED Notes (Signed)
Pt sister to bedside to visit; wanded in per ODS, ODS remains at bedside to supervise visit.

## 2017-10-18 NOTE — ED Notes (Signed)
Per pt's sister/caregiver [facility](Paulette Pride [phone]) pt was given all night-time meds PTA.

## 2017-10-18 NOTE — ED Notes (Signed)
This RN to bedside with lunch tray, attempted to feed pt at this time; pt ate approximately 5% of meal.

## 2017-10-18 NOTE — BH Assessment (Signed)
Pt not able to participate in TTS assessment due to altered mental status.

## 2017-10-18 NOTE — ED Notes (Signed)
Per pt's sister who is caretaker and legal guardian, she state she feeds him his meals.  Pt without dentures here.  Will adjust diet order and pass along info to next shift RN.

## 2017-10-18 NOTE — BH Assessment (Signed)
Writer called and left a HIPPA Compliant message with sister/caregiver (Paulette Pride-[phone]), requesting a return phone call.  Unable to leave message on the [phone] number. It continued to ring and no one answered.

## 2017-10-18 NOTE — ED Notes (Signed)
Pt provided breakfast tray at this time; this RN attempted to wake patient to offer breakfast.  Pt uninterested in breakfast at this time.
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
## 2017-10-18 NOTE — ED Notes (Signed)
Pt provided dinner tray; ED tech to bedside to assist with feeding.

## 2017-10-19 DIAGNOSIS — G309 Alzheimer's disease, unspecified: Secondary | ICD-10-CM

## 2017-10-19 DIAGNOSIS — F315 Bipolar disorder, current episode depressed, severe, with psychotic features: Secondary | ICD-10-CM

## 2017-10-19 DIAGNOSIS — F0281 Dementia in other diseases classified elsewhere with behavioral disturbance: Secondary | ICD-10-CM

## 2017-10-19 DIAGNOSIS — R410 Disorientation, unspecified: Secondary | ICD-10-CM

## 2017-10-19 MED ORDER — QUETIAPINE FUMARATE 100 MG PO TABS
100.0000 mg | ORAL_TABLET | Freq: Two times a day (BID) | ORAL | Status: DC
  Administered 2017-10-19 – 2017-10-20 (×2): 100 mg via ORAL
  Filled 2017-10-19 (×2): qty 1
  Filled 2017-10-19: qty 4
  Filled 2017-10-19: qty 1
  Filled 2017-10-19: qty 4

## 2017-10-19 MED ORDER — LORAZEPAM 2 MG/ML IJ SOLN
1.0000 mg | Freq: Once | INTRAMUSCULAR | Status: AC
Start: 2017-10-19 — End: 2017-10-19
  Administered 2017-10-19: 1 mg via INTRAMUSCULAR
  Filled 2017-10-19: qty 1

## 2017-10-19 NOTE — ED Notes (Signed)
IVC/Consult Pending  

## 2017-10-19 NOTE — ED Notes (Signed)
Received a call from his sister  - she requests that the psychiatrist call her today after consulting with her brother    Jaime Miller  [phone]

## 2017-10-19 NOTE — ED Notes (Signed)
Pt given second lunch by request. [place] sandwich tray provided.
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
## 2017-10-19 NOTE — ED Notes (Signed)
BEHAVIORAL HEALTH ROUNDING Patient sleeping: Yes.   Patient alert and oriented: eyes closed  Appears to be asleep Behavior appropriate: Yes.  ; If no, describe:  Nutrition and fluids offered: Yes  Toileting and hygiene offered: sleeping Sitter present: q 15 minute observations and security monitoring Law enforcement present: yes  ODS 

## 2017-10-19 NOTE — ED Notes (Signed)
Patient observed lying in bed with eyes closed  Even, unlabored respirations observed   NAD pt appears to be sleeping  I will continue to monitor along with every 15 minute visual observations and ongoing security monitoring    

## 2017-10-19 NOTE — ED Notes (Signed)
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
## 2017-10-19 NOTE — ED Notes (Signed)
Nurse tech in with patient to help him eat lunch.

## 2017-10-19 NOTE — ED Provider Notes (Signed)
-----------------------------------------   7:19 AM on 10/19/2017 -----------------------------------------   Blood pressure (!) 131/96, pulse 98, temperature 97.8 F (36.6 C), temperature source Oral, resp. rate 16, SpO2 97 %.  Patient apparently did receive Ativan.  Calm and cooperative at this time.  Disposition is pending Psychiatry/Behavioral Medicine team recommendations.     Jeanmarie PlantMcShane, Semaj Coburn A, MD 10/19/17 [phone]

## 2017-10-19 NOTE — ED Notes (Signed)
Nursing student at bedside to help feed pt.

## 2017-10-19 NOTE — ED Notes (Signed)
IVC, consult complete, pending inpt

## 2017-10-20 LAB — URINALYSIS, COMPLETE (UACMP) WITH MICROSCOPIC
Bacteria, UA: NONE SEEN
Bilirubin Urine: NEGATIVE
Glucose, UA: NEGATIVE mg/dL
HGB URINE DIPSTICK: NEGATIVE
Ketones, ur: NEGATIVE mg/dL
Leukocytes, UA: NEGATIVE
Nitrite: NEGATIVE
PH: 6 (ref 5.0–8.0)
Protein, ur: NEGATIVE mg/dL
SPECIFIC GRAVITY, URINE: 1.009 (ref 1.005–1.030)

## 2017-10-20 LAB — GLUCOSE, CAPILLARY: GLUCOSE-CAPILLARY: 79 mg/dL (ref 65–99)

## 2017-10-20 MED ORDER — QUETIAPINE FUMARATE 25 MG PO TABS
150.0000 mg | ORAL_TABLET | Freq: Two times a day (BID) | ORAL | Status: DC
  Administered 2017-10-20 – 2017-10-23 (×6): 150 mg via ORAL
  Filled 2017-10-20 (×6): qty 6

## 2017-10-20 NOTE — ED Notes (Signed)
Pt fed his lunch. Pt only wanted to eat applesauce, he spit out all other food given to him at the time. Pt only ate about 20% of his food.

## 2017-10-20 NOTE — ED Notes (Signed)
Pt given a tray and ate at this time.

## 2017-10-20 NOTE — Clinical Social Work Note (Signed)
CSW received consult for "ed." CS[facility] who stated Social Work was consulted in error. Patient recommended for inpatient psych admission. TTS following this patient. CSW signing off. Please reconsult if new Social Work needs arise.   Corlis HoveJeneya Zaniah [place], Theresia MajorsLCSWA, [facility] Clinical Social Worker-ED [phone]

## 2017-10-20 NOTE — Consult Note (Signed)
[facility]   Reason for Consult: Follow-up consult 72 year old man with chronic mental health problems currently in the emergency room because of agitation at home. Referring Physician: Alphonzo LemmingsMcShane Patient Identification: Jaime Miller MRN:  [id number] Principal Diagnosis: Bipolar I disorder, most recent episode (or current) depressed, severe, specified as with psychotic behavior ([facility]) Diagnosis:   Patient Active Problem List   Diagnosis Date Noted  . Bipolar I disorder, most recent episode (or current) depressed, severe, specified as with psychotic behavior ([facility]) [F31.5] 10/19/2017  . Dementia [F03.90] 10/19/2017  . Delirium [R41.0] 10/19/2017    Total Time spent with patient: 30 minutes  Subjective:   Jaime Miller is a 72 y.o. male patient admitted with see previous notes.  HPI: Patient continues to be confused.  Unable to engage in any conversation.  Patient's replies to questions are almost completely irrelevant.  He is not eating well not cooperating with treatment.  This appears to be a clear difference from his usual baseline  Past Psychiatric History: History of bipolar disorder  Risk to Self: Is patient at risk for suicide?: No Risk to Others:   Prior Inpatient Therapy:   Prior Outpatient Therapy:    Past Medical History:  Past Medical History:  Diagnosis Date  . Alzheimer disease   . Bipolar I disorder, most recent episode (or current) depressed, severe, specified as with psychotic behavior ([facility])   . BPH without obstruction/lower urinary tract symptoms   . Dementia   . Diabetes mellitus without complication ([facility])   . Gout   . Hyperlipidemia   . Hypertension   . Major depressive disorder   . Paranoid schizophrenia ([facility])   . TBI (traumatic brain injury) ([facility])   . Vitamin D deficiency    History reviewed. No pertinent surgical history. Family History: No family history on file. Family Psychiatric  History: Unknown Social History:   Social History   Substance and Sexual Activity  Alcohol Use No  . Frequency: Never     Social History   Substance and Sexual Activity  Drug Use No    Social History   Socioeconomic History  . Marital status: Widowed    Spouse name: None  . Number of children: None  . Years of education: None  . Highest education level: None  Social Needs  . Financial resource strain: None  . Food insecurity - worry: None  . Food insecurity - inability: None  . Transportation needs - medical: None  . Transportation needs - non-medical: None  Occupational History  . None  Tobacco Use  . Smoking status: Never Smoker  . Smokeless tobacco: Never Used  Substance and Sexual Activity  . Alcohol use: No    Frequency: Never  . Drug use: No  . Sexual activity: None  Other Topics Concern  . None  Social History Narrative  . None   Additional Social History:    Allergies:   Allergies  Allergen Reactions  . Etodolac     UNKNOWN    Labs:  Results for orders placed or performed during the hospital encounter of 10/18/17 (from the past 48 hour(s))  Glucose, capillary     Status: None   Collection Time: 10/20/17  5:48 PM  Result Value Ref Range   Glucose-Capillary 79 65 - 99 mg/dL    Current Facility-Administered Medications  Medication Dose Route Frequency Provider Last Rate Last Dose  . docusate sodium (COLACE) capsule 100 mg  100 mg Oral BID Dionne BucySiadecki, Sebastian, MD  100 mg at 10/20/17 1051  . donepezil (ARICEPT) tablet 10 mg  10 mg Oral QHS Dionne Bucy, MD   10 mg at 10/19/17 2234  . escitalopram (LEXAPRO) tablet 5 mg  5 mg Oral Daily Dionne Bucy, MD   5 mg at 10/20/17 1052  . finasteride (PROSCAR) tablet 5 mg  5 mg Oral Daily Dionne Bucy, MD   Stopped at 10/20/17 1030  . lisinopril (PRINIVIL,ZESTRIL) tablet 10 mg  10 mg Oral Daily Dionne Bucy, MD   Stopped at 10/20/17 1054   And  . hydrochlorothiazide (MICROZIDE) capsule 12.5 mg  12.5 mg Oral Daily  Dionne Bucy, MD   Stopped at 10/20/17 1105  . memantine (NAMENDA) tablet 10 mg  10 mg Oral BID Dionne Bucy, MD   10 mg at 10/20/17 1051  . QUEtiapine (SEROQUEL) tablet 150 mg  150 mg Oral BID Jaray Boliver T, MD      . senna (SENOKOT) tablet 8.6 mg  1 tablet Oral QHS Dionne Bucy, MD   8.6 mg at 10/19/17 2235  . simvastatin (ZOCOR) tablet 10 mg  10 mg Oral QHS Dionne Bucy, MD   10 mg at 10/19/17 2235  . tamsulosin (FLOMAX) capsule 0.4 mg  0.4 mg Oral QPM Dionne Bucy, MD   0.4 mg at 10/20/17 1741   Current Outpatient Medications  Medication Sig Dispense Refill  . bisacodyl (DULCOLAX) 10 MG suppository Place 10 mg rectally daily as needed for moderate constipation.    . Cholecalciferol 10000 units CAPS Take 1 capsule by mouth daily.    Marland Kitchen donepezil (ARICEPT) 10 MG tablet Take 10 mg by mouth at bedtime.    Marland Kitchen escitalopram (LEXAPRO) 5 MG tablet Take 5 mg by mouth daily.    . finasteride (PROSCAR) 5 MG tablet Take 5 mg by mouth daily.    Marland Kitchen lactulose, encephalopathy, (CHRONULAC) 10 GM/15ML SOLN Take 10 g by mouth every other day.    . lisinopril (PRINIVIL,ZESTRIL) 20 MG tablet Take 20 mg by mouth daily.     . memantine (NAMENDA) 10 MG tablet Take 10 mg by mouth 2 (two) times daily.    . QUEtiapine (SEROQUEL) 50 MG tablet Take 1 tablet (50 mg total) by mouth 2 (two) times daily. 60 tablet 1  . simvastatin (ZOCOR) 10 MG tablet Take 10 mg by mouth at bedtime.    . tamsulosin (FLOMAX) 0.4 MG CAPS capsule Take 0.4 mg by mouth every evening.    . TUBERCULIN PPD ID Inject into the skin. NEXT DOSE DUE January 15 3pm-11pm for step 2 dose.      Musculoskeletal: Strength & Muscle Tone: within normal limits Gait & Station: unsteady Patient leans: N/A  Psychiatric Specialty Exam: Physical Exam  Nursing note and vitals reviewed. Constitutional: He appears well-developed and well-nourished.  HENT:  Head: Normocephalic and atraumatic.  Eyes: Conjunctivae are normal.  Pupils are equal, round, and reactive to light.  Neck: Normal range of motion.  Cardiovascular: Regular rhythm and normal heart sounds.  Respiratory: Effort normal. No respiratory distress.  GI: Soft.  Musculoskeletal: Normal range of motion.  Neurological: He is alert.  Skin: Skin is warm and dry.  Psychiatric: His affect is labile and inappropriate. His speech is tangential. He is agitated and actively hallucinating. Thought content is paranoid. Cognition and memory are impaired. He expresses impulsivity. He is inattentive.    Review of Systems  Unable to perform ROS: Psychiatric disorder  Constitutional: Negative.     Blood pressure 123/62, pulse 87, temperature 98  F (36.7 C), temperature source Oral, resp. rate 18, SpO2 93 %.There is no height or weight on file to calculate BMI.  General Appearance: Disheveled  Eye Contact:  Minimal  Speech:  Garbled  Volume:  Decreased  Mood:  Euthymic  Affect:  Constricted and Inappropriate  Thought Process:  Disorganized  Orientation:  Negative  Thought Content:  Negative  Suicidal Thoughts:  No  Homicidal Thoughts:  No  Memory:  Negative  Judgement:  Negative  Insight:  Negative  Psychomotor Activity:  Negative  Concentration:  Concentration: Poor  Recall:  Poor  Fund of Knowledge:  Poor  Language:  Poor  Akathisia:  No  Handed:  Right  AIMS (if indicated):     Assets:  Resilience  ADL's:  Impaired  Cognition:  Impaired,  Moderate  Sleep:        Treatment Plan Summary: Daily contact with patient to assess and evaluate symptoms and progress in treatment, Medication management and Plan Patient still appears confused and psychotic.  Recommend hospitalization.  Increasing Seroquel to 150 mg twice a day.  Case reviewed with TTS.  Recommend geriatric psychiatry referral.  Disposition: Recommend psychiatric Inpatient admission when medically cleared.  Mordecai Rasmussen, MD 10/20/2017 7:14 PM

## 2017-10-20 NOTE — ED Notes (Signed)
Pt ambulatory with this RN and sitter at this time

## 2017-10-20 NOTE — ED Notes (Signed)
Pt sleeping comfortably at this time, sitter at bedside, pt in NAD

## 2017-10-20 NOTE — BH Assessment (Signed)
Pt attempted to contact sister (legal guardian) to update but was unable to leave vm [phone]

## 2017-10-20 NOTE — ED Provider Notes (Signed)
-----------------------------------------   6:03 AM on 10/20/2017 -----------------------------------------   Blood pressure 129/63, pulse 78, temperature 98 F (36.7 C), temperature source Oral, resp. rate 20, SpO2 98 %.  The patient had no acute events since last update.  Calm and cooperative at this time.  Disposition is pending Psychiatry/Behavioral Medicine team recommendations.     Irean HongSung, Levaughn Puccinelli J, MD 10/20/17 [phone]

## 2017-10-20 NOTE — BH Assessment (Signed)
Referral information for Psychiatric Hospitalization faxed to;   [facility]. [place] [facility] [phone])  . [facility] [phone]  . [facility] [phone])   . [facility] [place] [phone])  . [facility] [phone]  . [facility] [phone]).

## 2017-10-20 NOTE — ED Notes (Signed)
Pt being fed by this RN and sitter at this time

## 2017-10-20 NOTE — ED Notes (Signed)
Sherilyn CooterHenry RN and Sarah ED Tech changed the Pt's dipper and bedding.

## 2017-10-20 NOTE — ED Notes (Signed)
Pt fed his lunch tray by this EDT. Pt ate 25% of tray.

## 2017-10-20 NOTE — ED Notes (Signed)
IVC/  PENDING  PLACEMENT 

## 2017-10-21 NOTE — ED Notes (Signed)
BEHAVIORAL HEALTH ROUNDING Patient sleeping: Yes.   Patient alert and oriented: eyes closed  Appears to be asleep Behavior appropriate: Yes.  ; If no, describe:  Nutrition and fluids offered: Yes  Toileting and hygiene offered: sleeping Sitter present: q 15 minute observations and security monitoring Law enforcement present: yes  ODS 

## 2017-10-21 NOTE — BH Assessment (Addendum)
This Clinical research associatewriter received call from [facility] requesting ALL labs and IVC paperwork. Faxed all labs and IVC paperwork to all 6 facilities where referrals were sent.  [facility]  [facility] [address]  [facility]  [facility]

## 2017-10-21 NOTE — ED Notes (Signed)
ED  Is the patient under IVC or is there intent for IVC: Yes.   Is the patient medically cleared: Yes.   Is there vacancy in the ED BHU: Yes.   Is the population mix appropriate for patient: geripsych  Is the patient awaiting placement in inpatient or outpatient setting: geripsych placement pending  Has the patient had a psychiatric consult: Yes.   Survey of unit performed for contraband, proper placement and condition of furniture, tampering with fixtures in bathroom, shower, and each patient room: Yes.  ; Findings:  APPEARANCE/BEHAVIOR Calm and cooperative NEURO ASSESSMENT Orientation: oriented to self  Denies pain Hallucinations: No.None noted (Hallucinations) Speech: Normal Gait: unsteady - stand by assistance provided   RESPIRATORY ASSESSMENT Even  Unlabored respirations  CARDIOVASCULAR ASSESSMENT Pulses equal   regular rate  Skin warm and dry   GASTROINTESTINAL ASSESSMENT no GI complaint EXTREMITIES Full ROM  PLAN OF CARE Provide calm/safe environment. Vital signs assessed twice daily. ED BHU Assessment once each 12-hour shift. Collaborate with TTS when available or as condition indicates. Assure the ED provider has rounded once each shift. Provide and encourage hygiene. Provide redirection as needed. Assess for escalating behavior; address immediately and inform ED provider.  Assess family dynamic and appropriateness for visitation as needed: Yes.  ; If necessary, describe findings:  Educate the patient/family about BHU procedures/visitation: Yes.  ; If necessary, describe findings:

## 2017-10-21 NOTE — ED Notes (Signed)
Patient observed lying in bed with eyes closed  Even, unlabored respirations observed   NAD pt appears to be sleeping  I will continue to monitor along with every 15 minute visual observations and ongoing security monitoring    

## 2017-10-21 NOTE — ED Notes (Signed)
BEHAVIORAL HEALTH ROUNDING Patient sleeping: No. Patient alert and oriented: yes Behavior appropriate: Yes.  ; If no, describe:  Nutrition and fluids offered: yes Toileting and hygiene offered: Yes  Sitter present: q15 minute observations and security  monitoring Law enforcement present: Yes  ODS  

## 2017-10-21 NOTE — ED Provider Notes (Signed)
-----------------------------------------   8:54 AM on 10/21/2017 -----------------------------------------   Blood pressure 110/78, pulse (!) 56, temperature 97.7 F (36.5 C), temperature source Oral, resp. rate 18, SpO2 100 %.  The patient had no acute events since last update.  Calm and cooperative at this time.  Disposition is pending Psychiatry/Behavioral Medicine team recommendations.     Rebecka ApleyWebster, Tarae Wooden P, MD 10/21/17 [phone]

## 2017-10-21 NOTE — BH Assessment (Addendum)
Referral information for Psychiatric Hospitalization faxed to;     [facility] [phone])- Per Fay Records, patient denied due to dementia.     [facility] [phone][facility] Per Shanda Bumps, referral not received.     [facility]- Per [place], referral not received.     [facility] [phone])- Left HIPPA compliant message for return call.   [facility] [phone]) -Unable to speak anyone.    [facility] [phone])- Per Thayer Ohm, faxed not received.

## 2017-10-21 NOTE — ED Notes (Signed)

## 2017-10-21 NOTE — ED Notes (Signed)
BEHAVIORAL HEALTH ROUNDING Patient sleeping: Yes.   Patient alert and oriented: eyes closed  Appears to be asleep Behavior appropriate: Yes.  ; If no, describe:  Nutrition and fluids offered: Yes  Toileting and hygiene offered: sleeping Sitter present: q 15 minute observations and security monitoring Law enforcement present: yes   

## 2017-10-21 NOTE — ED Notes (Signed)
IVC/Pending Placement 

## 2017-10-21 NOTE — BH Assessment (Signed)
Re-[facility]ed [facility] to [facility], [facility], and [facility].

## 2017-10-22 ENCOUNTER — Emergency Department: Payer: 59

## 2017-10-22 DIAGNOSIS — S299XXA Unspecified injury of thorax, initial encounter: Secondary | ICD-10-CM | POA: Diagnosis not present

## 2017-10-22 MED ORDER — SENNOSIDES 8.8 MG/5ML PO SYRP
5.0000 mL | ORAL_SOLUTION | Freq: Every day | ORAL | Status: DC
  Administered 2017-10-22: 5 mL via ORAL
  Filled 2017-10-22 (×2): qty 5

## 2017-10-22 MED ORDER — DOCUSATE SODIUM 50 MG/5ML PO LIQD
100.0000 mg | Freq: Two times a day (BID) | ORAL | Status: DC
  Administered 2017-10-22 – 2017-10-23 (×2): 100 mg via ORAL
  Filled 2017-10-22 (×3): qty 10

## 2017-10-22 NOTE — ED Notes (Signed)
PT IVC/ PENDING PLACEMENT  

## 2017-10-22 NOTE — ED Notes (Signed)
Pt sleeping - sitter states planning on giving him a bath - clean scrubs obtained.

## 2017-10-22 NOTE — ED Provider Notes (Signed)
TTS let me know accepting facility was requesting chest x-ray this was ordered Chest x-ray result reviewed by radiologist report:  IMPRESSION: No acute abnormality.   Governor RooksLord, Drevin Ortner, MD 10/22/17 1540

## 2017-10-22 NOTE — BH Assessment (Signed)
Writer received phone call from [facility]. They have reviewed faxed information. They are no requesting CT-Scan due to patient having been prescribed Lactulose. Also requesting updated vitals. Writer updated patient's nurse Darl Pikes(Susan) and ER MD (Dr. Darnelle CatalanMalinda).

## 2017-10-22 NOTE — ED Provider Notes (Signed)
Vitals:   10/21/17 1349 10/22/17 0600  BP: 115/75 112/68  Pulse:  (!) 56  Resp:  15  Temp:  98 F (36.7 C)  SpO2:  100%   No acute events reported to me overnight by physician or nursing report.  I reviewed TTS documentation and patient is under involuntary commitment due to aggression with dementia.  Patient is awaiting disposition or geriatric psychiatric placement.   Governor RooksLord, Shuntia Exton, MD 10/22/17 [phone]

## 2017-10-22 NOTE — ED Provider Notes (Signed)
went to see the patient at the nurse's request. Patient is awake and alert and looks well speaking well. Heart regular rate and rhythm no audible murmurs lungs are clear abdomen soft and nontender is no edema in the legs. Swallowing study is ordered for the morning since he's having trouble with his medicines.   Jaime NatalMalinda, Praise Dolecki F, MD 10/22/17 2017

## 2017-10-22 NOTE — ED Notes (Signed)
Patient transported to X-ray, with Recruitment consultantsafety sitter and security present

## 2017-10-22 NOTE — ED Notes (Signed)
Report received , care assumed. Pt resting in darkened room eyes closed snoring respirations, no distress noted. safety sitter present Bonita Quin(Linda H) , care handoff with safety sitters Kerrie BuffaloLinda H and Liborio NixonJanice.

## 2017-10-22 NOTE — ED Notes (Signed)
Spoke with pharmacy regarding pt having dysphagia with his medication. Two orders changed to liquid. Pharmacist states all other meds can be crushed and placed in liquid or apple sauce.

## 2017-10-22 NOTE — ED Notes (Addendum)
Pt had trouble swallowing his pills even with apple sauce and being crushed (two were unable to be crushed) - needs swallow eval

## 2017-10-22 NOTE — BH Assessment (Signed)
Writer received phone call from [phone]) requesting EKG and Chest X-Ray. Writer updated patient's nurse Tammy Sours(Greg) and ER MD (Dr. Shaune PollackLord).

## 2017-10-23 MED ORDER — DOCUSATE SODIUM 100 MG PO CAPS
ORAL_CAPSULE | ORAL | Status: AC
  Filled 2017-10-23: qty 1

## 2017-10-23 NOTE — ED Notes (Signed)
emtala reviewed by this rN 

## 2017-10-23 NOTE — Evaluation (Signed)
Clinical/Bedside Swallow Evaluation Patient Details  Name: Jaime Miller MRN: [id number] Date of Birth: 06/18/1946  Today's Date: 10/23/2017 Time: SLP Start Time (ACUTE ONLY): 1040 SLP Stop Time (ACUTE ONLY): 1110 SLP Time Calculation (min) (ACUTE ONLY): 30 min  Past Medical History:  Past Medical History:  Diagnosis Date  . Alzheimer disease   . Bipolar I disorder, most recent episode (or current) depressed, severe, specified as with psychotic behavior (HCC)   . BPH without obstruction/lower urinary tract symptoms   . Dementia   . Diabetes mellitus without complication (HCC)   . Gout   . Hyperlipidemia   . Hypertension   . Major depressive disorder   . Paranoid schizophrenia (HCC)   . TBI (traumatic brain injury) (HCC)   . Vitamin D deficiency    Past Surgical History: History reviewed. No pertinent surgical history. HPI:  72 year old man with chronic mental health problems currently in the emergency room because of agitation at home. Pt is awaiting geriatric pyschiatric placment. Per nsg notes pt has not been able to take meds well and is pushing food out of his mouth. Nsg aid currently reports that pt was able to eat breakfast well except dentures not fitting well.    Assessment / Plan / Recommendation Clinical Impression  Pt presents with mild oral dysphagia d/t pt edentulous and not cogntivily able to allow dentures to be placed with all PO intake. Pt demonstrated no overt s/s of aspiration w/any tested consistency. Pt was given trials of thin water by straw, puree (applesauce and ice cream), and solid. No overt cough or throat clear was observed. Vocal quality remained clear throughout trials. Pt demonstrated min oral residue following solid consistency (pt was unwilling to place dentures in his mouth for assessement). Although, nsg aide reported that they did no fit well this am with breakfast and that food was getting underneath them. Oral phase appeared [facility] for all other  tested consistencies. No pocketing or holding or anterior spillage was observed. Oral motor abilities were assessed during oral phase as pt was unable to follow commands. Oral motor abilities appeareed grossly WFL. Pt is judged to be at mild risk of aspiration d/t cogntitive impairment and mild oral dysphagia with solids. Mechanical soft consistency would decrease aspiration risk. Recommend Dysphagia III diet w/thin liquids. Recommend attempting meds in ice cream to make them more palatable to pt.   SLP Visit Diagnosis: Dysphagia, oral phase (R13.11)    Aspiration Risk  Mild aspiration risk    Diet Recommendation Dysphagia 3 (Mech soft);Thin liquid   Liquid Administration via: Straw;Cup Medication Administration: Other (Comment)(Try with ice cream or pudding) Supervision: Staff to assist with self feeding Compensations: Minimize environmental distractions;Slow rate;Small sips/bites Postural Changes: Seated upright at 90 degrees    Other  Recommendations Oral Care Recommendations: Staff/trained caregiver to provide oral care   Follow up Recommendations Other (comment)(Pt is awaiting placement)      Frequency and Duration Other (Comment)(x1 visit only)          Prognosis Prognosis for Safe Diet Advancement: Fair Barriers to Reach Goals: Cognitive deficits      Swallow Study   General Date of Onset: 10/23/17 HPI: 72 year old man with chronic mental health problems currently in the emergency room because of agitation at home. Pt is awaiting geriatric pyschiatric placment. Per nsg notes pt has not been able to take meds well and is pushing food out of his mouth. Nsg aid currently reports that pt was able to eat breakfast well except  dentures not fitting well.  Previous Swallow Assessment: none Diet Prior to this Study: Dysphagia 1 (puree) Temperature Spikes Noted: No Respiratory Status: Room air History of Recent Intubation: No Behavior/Cognition: Confused;Requires cueing;Doesn't  follow directions Oral Cavity Assessment: Within Functional Limits Oral Care Completed by SLP: No Oral Cavity - Dentition: Dentures, top;Dentures, bottom;Other (Comment)(not wearing dentures currently and could not get pt to put them in) Vision: (unable to assess, pt not opening his eyes) Self-Feeding Abilities: Total assist Patient Positioning: Upright in bed Baseline Vocal Quality: Normal Volitional Cough: Cognitively unable to elicit Volitional Swallow: Able to elicit    Oral/Motor/Sensory Function Overall Oral Motor/Sensory Function: Other (comment)(Grossly assessed and pt appeared [facility]. Pt unable to follow commands.)   Ice Chips Ice chips: Not tested   Thin Liquid Thin Liquid: Within functional limits Presentation: Straw    Nectar Thick Nectar Thick Liquid: Not tested   Honey Thick Honey Thick Liquid: Not tested   Puree Puree: Within functional limits Presentation: Spoon Other Comments: Puree applesauce as well as ice cream given   Solid   GO   Solid: Impaired Presentation: Self Fed Oral Phase Impairments: Impaired mastication Oral Phase Functional Implications: Oral residue Other Comments: Pt demonstrated increased mastication time as well as min oral residue post swallow. Able to clear with a f/u sip of thin. Pt not wearing dentures which likley contributed to difficutly (unable to get pt to put dentures in)        [place]aran 10/23/2017,11:32 AM

## 2017-10-23 NOTE — BH Assessment (Signed)
Writer spoke with patient's sister Wendall Papa(Paula Pride), while she was visiting there patient. Writer informed her patient will be [facility]. Writer provided sister with contact information for [facility].

## 2017-10-23 NOTE — ED Provider Notes (Signed)
Screening EKG PERFORMED, interpreted by me  Date: 10/23/2017  Rate: 63  Rhythm: normal sinus rhythm  QRS Axis: normal  Intervals: normal  ST/T Wave abnormalities: normal  Conduction Disutrbances: none  Narrative Interpretation: unremarkable       Sharman CheekStafford, Kennadee Walthour, MD 10/23/17 1123

## 2017-10-23 NOTE — ED Notes (Signed)
PT IVC/ACCEPTED TO [facility]/TRANSPORTATION CALLED.

## 2017-10-23 NOTE — BH Assessment (Signed)
[facility] has been [facility] Patient assigned to Psych Geriatric Unit Accepting physician is Dr. Joseph ArtSubedi. Call report to [phone]. Representative was [employer].   ER Staff is aware of it:  Emilie, ER Sect.;  Isaiah BlakesJerrie, Patient's Nurse  Writer called and left a HIPPA Compliant message with Sister/POA (Paulette [phone]), requesting a return phone call.

## 2017-10-23 NOTE — ED Notes (Signed)
Patient partially administered medications that were crushed and mixed in applesauce. Patient became combative and verbally aggressive demanding to speak to sister, and refused to cooperate at this time.

## 2017-10-23 NOTE — BH Assessment (Signed)
Writer received phone call from [facility] ([phone]) asking for updated IVC's and EKG. Pending the information, they will offer a bed for inpatient treatment.  Writer informed ER Engineer, manufacturing systemsectary Lewie Loron(Emilie) and patient's RN Dorene Sorrow(Jerry) of [facility] request.

## 2017-10-24 DIAGNOSIS — K5641 Fecal impaction: Secondary | ICD-10-CM | POA: Diagnosis not present

## 2017-10-24 DIAGNOSIS — Z87820 Personal history of traumatic brain injury: Secondary | ICD-10-CM | POA: Diagnosis not present

## 2017-10-24 DIAGNOSIS — N289 Disorder of kidney and ureter, unspecified: Secondary | ICD-10-CM | POA: Diagnosis not present

## 2017-10-24 DIAGNOSIS — N400 Enlarged prostate without lower urinary tract symptoms: Secondary | ICD-10-CM | POA: Diagnosis not present

## 2017-10-24 DIAGNOSIS — R109 Unspecified abdominal pain: Secondary | ICD-10-CM | POA: Diagnosis not present

## 2017-10-24 DIAGNOSIS — Z87442 Personal history of urinary calculi: Secondary | ICD-10-CM | POA: Diagnosis not present

## 2017-10-24 DIAGNOSIS — K6289 Other specified diseases of anus and rectum: Secondary | ICD-10-CM | POA: Diagnosis not present

## 2017-10-25 DIAGNOSIS — K5900 Constipation, unspecified: Secondary | ICD-10-CM | POA: Diagnosis not present

## 2017-10-25 DIAGNOSIS — N289 Disorder of kidney and ureter, unspecified: Secondary | ICD-10-CM | POA: Diagnosis not present

## 2017-10-25 DIAGNOSIS — E119 Type 2 diabetes mellitus without complications: Secondary | ICD-10-CM | POA: Diagnosis not present

## 2017-10-25 DIAGNOSIS — Z87820 Personal history of traumatic brain injury: Secondary | ICD-10-CM | POA: Diagnosis not present

## 2017-10-26 DIAGNOSIS — N289 Disorder of kidney and ureter, unspecified: Secondary | ICD-10-CM | POA: Diagnosis not present

## 2017-10-26 DIAGNOSIS — R338 Other retention of urine: Secondary | ICD-10-CM | POA: Diagnosis not present

## 2017-10-26 DIAGNOSIS — K5900 Constipation, unspecified: Secondary | ICD-10-CM | POA: Diagnosis not present

## 2017-10-27 DIAGNOSIS — R338 Other retention of urine: Secondary | ICD-10-CM | POA: Diagnosis not present

## 2017-10-27 DIAGNOSIS — N189 Chronic kidney disease, unspecified: Secondary | ICD-10-CM | POA: Diagnosis not present

## 2017-10-27 DIAGNOSIS — N289 Disorder of kidney and ureter, unspecified: Secondary | ICD-10-CM | POA: Diagnosis not present

## 2017-10-27 DIAGNOSIS — N400 Enlarged prostate without lower urinary tract symptoms: Secondary | ICD-10-CM | POA: Diagnosis not present

## 2017-10-27 DIAGNOSIS — K5900 Constipation, unspecified: Secondary | ICD-10-CM | POA: Diagnosis not present

## 2017-10-27 DIAGNOSIS — E119 Type 2 diabetes mellitus without complications: Secondary | ICD-10-CM | POA: Diagnosis not present

## 2017-10-28 DIAGNOSIS — K5900 Constipation, unspecified: Secondary | ICD-10-CM | POA: Diagnosis not present

## 2017-10-28 DIAGNOSIS — E119 Type 2 diabetes mellitus without complications: Secondary | ICD-10-CM | POA: Diagnosis not present

## 2017-10-28 DIAGNOSIS — N289 Disorder of kidney and ureter, unspecified: Secondary | ICD-10-CM | POA: Diagnosis not present

## 2017-10-28 DIAGNOSIS — R338 Other retention of urine: Secondary | ICD-10-CM | POA: Diagnosis not present

## 2017-10-28 DIAGNOSIS — N401 Enlarged prostate with lower urinary tract symptoms: Secondary | ICD-10-CM | POA: Diagnosis not present

## 2017-10-29 DIAGNOSIS — E119 Type 2 diabetes mellitus without complications: Secondary | ICD-10-CM | POA: Diagnosis not present

## 2017-10-29 DIAGNOSIS — K5900 Constipation, unspecified: Secondary | ICD-10-CM | POA: Diagnosis not present

## 2017-10-29 DIAGNOSIS — N289 Disorder of kidney and ureter, unspecified: Secondary | ICD-10-CM | POA: Diagnosis not present

## 2017-10-29 DIAGNOSIS — R338 Other retention of urine: Secondary | ICD-10-CM | POA: Diagnosis not present

## 2017-10-30 DIAGNOSIS — K5900 Constipation, unspecified: Secondary | ICD-10-CM | POA: Diagnosis not present

## 2017-10-30 DIAGNOSIS — Z515 Encounter for palliative care: Secondary | ICD-10-CM | POA: Diagnosis not present

## 2017-10-30 DIAGNOSIS — E119 Type 2 diabetes mellitus without complications: Secondary | ICD-10-CM | POA: Diagnosis not present

## 2017-10-30 DIAGNOSIS — Z7189 Other specified counseling: Secondary | ICD-10-CM | POA: Diagnosis not present

## 2017-10-30 DIAGNOSIS — N289 Disorder of kidney and ureter, unspecified: Secondary | ICD-10-CM | POA: Diagnosis not present

## 2017-10-30 DIAGNOSIS — R338 Other retention of urine: Secondary | ICD-10-CM | POA: Diagnosis not present

## 2017-11-23 ENCOUNTER — Other Ambulatory Visit: Payer: Self-pay

## 2017-11-23 ENCOUNTER — Emergency Department
Admission: EM | Admit: 2017-11-23 | Discharge: 2017-11-23 | Disposition: A | Discharge status: Home / Self Care | Payer: Medicare Other | Attending: Emergency Medicine | Admitting: Emergency Medicine

## 2017-11-23 ENCOUNTER — Encounter: Payer: Self-pay | Admitting: Emergency Medicine

## 2017-11-23 DIAGNOSIS — Z5321 Procedure and treatment not carried out due to patient leaving prior to being seen by health care provider: Secondary | ICD-10-CM | POA: Diagnosis not present

## 2017-11-23 DIAGNOSIS — R109 Unspecified abdominal pain: Secondary | ICD-10-CM | POA: Diagnosis not present

## 2017-11-23 DIAGNOSIS — R410 Disorientation, unspecified: Secondary | ICD-10-CM | POA: Diagnosis not present

## 2017-11-23 LAB — CBC
HEMATOCRIT: 31 % — AB (ref 40.0–52.0)
HEMOGLOBIN: 10.4 g/dL — AB (ref 13.0–18.0)
MCH: 29.8 pg (ref 26.0–34.0)
MCHC: 33.6 g/dL (ref 32.0–36.0)
MCV: 88.7 fL (ref 80.0–100.0)
PLATELETS: 248 10*3/uL (ref 150–440)
RBC: 3.49 MIL/uL — AB (ref 4.40–5.90)
RDW: 16 % — ABNORMAL HIGH (ref 11.5–14.5)
WBC: 3.9 10*3/uL (ref 3.8–10.6)

## 2017-11-23 LAB — COMPREHENSIVE METABOLIC PANEL
ALT: 12 U/L — AB (ref 17–63)
AST: 28 U/L (ref 15–41)
Albumin: 3.9 g/dL (ref 3.5–5.0)
Alkaline Phosphatase: 73 U/L (ref 38–126)
Anion gap: 5 (ref 5–15)
BUN: 14 mg/dL (ref 6–20)
CHLORIDE: 108 mmol/L (ref 101–111)
CO2: 28 mmol/L (ref 22–32)
CREATININE: 1.19 mg/dL (ref 0.61–1.24)
Calcium: 9 mg/dL (ref 8.9–10.3)
GFR calc non Af Amer: 59 mL/min — ABNORMAL LOW (ref >60)
Glucose, Bld: 147 mg/dL — ABNORMAL HIGH (ref 65–99)
POTASSIUM: 4 mmol/L (ref 3.5–5.1)
SODIUM: 141 mmol/L (ref 135–145)
Total Bilirubin: 0.5 mg/dL (ref 0.3–1.2)
Total Protein: 6.9 g/dL (ref 6.5–8.1)

## 2017-11-23 LAB — LIPASE, BLOOD: LIPASE: 30 U/L (ref 11–51)

## 2017-11-23 NOTE — ED Triage Notes (Signed)
Patient awake and alert in triage.  Fidgeting constantly.

## 2017-11-23 NOTE — ED Triage Notes (Signed)
Abdominal pain and confusion since Friday.  Patient's family has been giving patient lactulose over the weekend, with minimal results.

## 2017-11-23 NOTE — ED Notes (Signed)
Pt family to STAT desk. States pt had a very large bowel movement that "probably emptied his bowels". Family are going to take pt home now that he is feeling better. Will return to ED if symptoms persist.

## 2018-01-11 ENCOUNTER — Emergency Department
Admission: EM | Admit: 2018-01-11 | Discharge: 2018-01-14 | Disposition: A | Discharge status: Other Acute Inpatient Hospital | Payer: Medicare Other | Attending: Emergency Medicine | Admitting: Emergency Medicine

## 2018-01-11 ENCOUNTER — Emergency Department: Payer: Medicare Other

## 2018-01-11 ENCOUNTER — Other Ambulatory Visit: Payer: Self-pay

## 2018-01-11 DIAGNOSIS — F315 Bipolar disorder, current episode depressed, severe, with psychotic features: Secondary | ICD-10-CM | POA: Insufficient documentation

## 2018-01-11 DIAGNOSIS — F0281 Dementia in other diseases classified elsewhere with behavioral disturbance: Secondary | ICD-10-CM | POA: Insufficient documentation

## 2018-01-11 DIAGNOSIS — G301 Alzheimer's disease with late onset: Secondary | ICD-10-CM | POA: Diagnosis not present

## 2018-01-11 DIAGNOSIS — E119 Type 2 diabetes mellitus without complications: Secondary | ICD-10-CM | POA: Diagnosis not present

## 2018-01-11 DIAGNOSIS — Z79899 Other long term (current) drug therapy: Secondary | ICD-10-CM | POA: Insufficient documentation

## 2018-01-11 DIAGNOSIS — I10 Essential (primary) hypertension: Secondary | ICD-10-CM | POA: Diagnosis not present

## 2018-01-11 DIAGNOSIS — F0391 Unspecified dementia with behavioral disturbance: Secondary | ICD-10-CM

## 2018-01-11 DIAGNOSIS — Z046 Encounter for general psychiatric examination, requested by authority: Secondary | ICD-10-CM | POA: Diagnosis present

## 2018-01-11 DIAGNOSIS — G309 Alzheimer's disease, unspecified: Secondary | ICD-10-CM | POA: Diagnosis not present

## 2018-01-11 LAB — COMPREHENSIVE METABOLIC PANEL
ALK PHOS: 58 U/L (ref 38–126)
ALT: 15 U/L — AB (ref 17–63)
AST: 27 U/L (ref 15–41)
Albumin: 3.9 g/dL (ref 3.5–5.0)
Anion gap: 5 (ref 5–15)
BUN: 16 mg/dL (ref 6–20)
CALCIUM: 8.9 mg/dL (ref 8.9–10.3)
CO2: 28 mmol/L (ref 22–32)
CREATININE: 1.18 mg/dL (ref 0.61–1.24)
Chloride: 107 mmol/L (ref 101–111)
GFR calc non Af Amer: 59 mL/min — ABNORMAL LOW (ref >60)
GLUCOSE: 138 mg/dL — AB (ref 65–99)
Potassium: 4.2 mmol/L (ref 3.5–5.1)
SODIUM: 140 mmol/L (ref 135–145)
Total Bilirubin: 0.4 mg/dL (ref 0.3–1.2)
Total Protein: 7 g/dL (ref 6.5–8.1)

## 2018-01-11 LAB — CBC WITH DIFFERENTIAL/PLATELET
Basophils Absolute: 0 10*3/uL (ref 0–0.1)
Basophils Relative: 1 %
EOS ABS: 0.1 10*3/uL (ref 0–0.7)
Eosinophils Relative: 3 %
HCT: 31.5 % — ABNORMAL LOW (ref 40.0–52.0)
HEMOGLOBIN: 10.3 g/dL — AB (ref 13.0–18.0)
LYMPHS ABS: 1 10*3/uL (ref 1.0–3.6)
Lymphocytes Relative: 27 %
MCH: 29.2 pg (ref 26.0–34.0)
MCHC: 32.8 g/dL (ref 32.0–36.0)
MCV: 89.1 fL (ref 80.0–100.0)
Monocytes Absolute: 0.3 10*3/uL (ref 0.2–1.0)
Monocytes Relative: 9 %
NEUTROS PCT: 60 %
Neutro Abs: 2.4 10*3/uL (ref 1.4–6.5)
Platelets: 186 10*3/uL (ref 150–440)
RBC: 3.54 MIL/uL — AB (ref 4.40–5.90)
RDW: 14.8 % — ABNORMAL HIGH (ref 11.5–14.5)
WBC: 3.9 10*3/uL (ref 3.8–10.6)

## 2018-01-11 LAB — ETHANOL: Alcohol, Ethyl (B): <10 mg/dL (ref <10)

## 2018-01-11 LAB — SALICYLATE LEVEL

## 2018-01-11 LAB — ACETAMINOPHEN LEVEL: Acetaminophen (Tylenol), Serum: <10 ug/mL — ABNORMAL LOW (ref 10–30)

## 2018-01-11 LAB — TROPONIN I: Troponin I: <0.03 ng/mL (ref <0.03)

## 2018-01-11 MED ORDER — LORAZEPAM 1 MG PO TABS
1.0000 mg | ORAL_TABLET | Freq: Once | ORAL | Status: AC
  Administered 2018-01-11: 1 mg via ORAL

## 2018-01-11 MED ORDER — MEMANTINE HCL 5 MG PO TABS
10.0000 mg | ORAL_TABLET | Freq: Two times a day (BID) | ORAL | Status: DC
  Administered 2018-01-11 – 2018-01-14 (×7): 10 mg via ORAL
  Filled 2018-01-11 (×7): qty 2

## 2018-01-11 MED ORDER — FINASTERIDE 5 MG PO TABS
5.0000 mg | ORAL_TABLET | Freq: Every day | ORAL | Status: DC
  Administered 2018-01-11 – 2018-01-14 (×4): 5 mg via ORAL
  Filled 2018-01-11 (×4): qty 1

## 2018-01-11 MED ORDER — SIMVASTATIN 10 MG PO TABS
10.0000 mg | ORAL_TABLET | Freq: Every day | ORAL | Status: DC
  Administered 2018-01-11 – 2018-01-12 (×2): 10 mg via ORAL
  Filled 2018-01-11 (×3): qty 1

## 2018-01-11 MED ORDER — ESCITALOPRAM OXALATE 10 MG PO TABS
5.0000 mg | ORAL_TABLET | Freq: Every day | ORAL | Status: DC
  Administered 2018-01-11 – 2018-01-14 (×4): 5 mg via ORAL
  Filled 2018-01-11 (×4): qty 1

## 2018-01-11 MED ORDER — BISACODYL 10 MG RE SUPP
10.0000 mg | Freq: Every day | RECTAL | Status: DC | PRN
  Filled 2018-01-11: qty 1

## 2018-01-11 MED ORDER — QUETIAPINE FUMARATE 25 MG PO TABS
50.0000 mg | ORAL_TABLET | Freq: Two times a day (BID) | ORAL | Status: DC
  Administered 2018-01-11 – 2018-01-14 (×7): 50 mg via ORAL
  Filled 2018-01-11 (×7): qty 2

## 2018-01-11 MED ORDER — LORAZEPAM 1 MG PO TABS
ORAL_TABLET | ORAL | Status: AC
  Filled 2018-01-11: qty 1

## 2018-01-11 MED ORDER — VITAMIN D 1000 UNITS PO TABS
10000.0000 [IU] | ORAL_TABLET | Freq: Every day | ORAL | Status: DC
  Filled 2018-01-11: qty 10

## 2018-01-11 MED ORDER — DONEPEZIL HCL 5 MG PO TABS
10.0000 mg | ORAL_TABLET | Freq: Every day | ORAL | Status: DC
  Administered 2018-01-11 – 2018-01-13 (×3): 10 mg via ORAL
  Filled 2018-01-11 (×3): qty 2

## 2018-01-11 MED ORDER — VITAMIN D 1000 UNITS PO TABS
1000.0000 [IU] | ORAL_TABLET | Freq: Every day | ORAL | Status: DC
  Administered 2018-01-11 – 2018-01-14 (×4): 1000 [IU] via ORAL
  Filled 2018-01-11 (×3): qty 1

## 2018-01-11 MED ORDER — QUETIAPINE FUMARATE 25 MG PO TABS
100.0000 mg | ORAL_TABLET | Freq: Once | ORAL | Status: AC
Start: 2018-01-11 — End: 2018-01-11
  Administered 2018-01-11: 100 mg via ORAL
  Filled 2018-01-11: qty 4

## 2018-01-11 MED ORDER — TAMSULOSIN HCL 0.4 MG PO CAPS
0.4000 mg | ORAL_CAPSULE | Freq: Every evening | ORAL | Status: DC
  Administered 2018-01-12 – 2018-01-13 (×2): 0.4 mg via ORAL
  Filled 2018-01-11 (×2): qty 1

## 2018-01-11 NOTE — ED Notes (Signed)
[facility] [place]. Pt awake and calm; still needs temp and blood work.

## 2018-01-11 NOTE — ED Notes (Signed)
Pt still standing; appeared to be almost sleeping while standing. This nurse and NT Ladona Ridgelaylor assisted pt into the bed and covered him up.

## 2018-01-11 NOTE — BH Assessment (Signed)
[facility]. This write spoke with Ms Jeanella AntonReece [phone] ext [phone] in regards to pt placement and submitted transfer paperwork.
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
## 2018-01-11 NOTE — ED Notes (Signed)
Pt's sister called and stated that [facility] and [facility]. Transfer center phone [phone]; Annabell HowellsCherie Reece x [phone]; Alwyn RenLarry Stinson x [phone]; please keep sister up to date. She also mentioned that pt's provider is Dr. Wynelle LinkSun.

## 2018-01-11 NOTE — ED Notes (Signed)
[facility]
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
## 2018-01-11 NOTE — ED Notes (Signed)
Pt cleaned up due to incontinence of bowel.
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
## 2018-01-11 NOTE — ED Notes (Signed)
Patient had to be redirected multiple times to go back in room. This Clinical research associatewriter assisted patient into room and patient sat on bed.

## 2018-01-11 NOTE — ED Notes (Signed)
Pt took most of his meds when crushed in applesauce

## 2018-01-11 NOTE — ED Notes (Signed)
Dr P at bedside

## 2018-01-11 NOTE — ED Notes (Signed)
Pt bed linen & clothing changed by this Clinical research associatewriter and Rn Selena Batten(Kim) due to pt urinating

## 2018-01-11 NOTE — ED Notes (Signed)
Pt having to be redirected by this tech and RN Selena Batten(Kim), pt becoming agitated at staff when being redirected back to his room.

## 2018-01-11 NOTE — ED Notes (Signed)
Patient given a snack of ice cream.

## 2018-01-11 NOTE — ED Notes (Signed)
Attempted blood draw; pt still uncooperative, even though resting

## 2018-01-11 NOTE — ED Notes (Signed)
Pt incontinent of bowel. Pt cleaned, clothing and linens changed; breakfast tray given.

## 2018-01-11 NOTE — ED Notes (Signed)
Pt incontinent of bowel; diaper changed and pt cleaned.

## 2018-01-12 MED ORDER — LORAZEPAM 1 MG PO TABS
1.0000 mg | ORAL_TABLET | Freq: Once | ORAL | Status: AC
  Administered 2018-01-12: 1 mg via ORAL
  Filled 2018-01-12: qty 1

## 2018-01-12 NOTE — ED Notes (Signed)
This Clinical research associatewriter checked patient to see if needed to be changed. Patient currently dry.

## 2018-01-12 NOTE — ED Provider Notes (Signed)
-----------------------------------------   7:34 AM on 01/12/2018 -----------------------------------------   Blood pressure 130/81, pulse (!) 58, temperature 97.6 F (36.4 C), temperature source Axillary, resp. rate 16, height 6' (1.829 m), weight 77.1 kg (170 lb), SpO2 100 %.  The patient had no acute events since last update.  Calm and cooperative at this time.  Disposition is pending Psychiatry/Behavioral Medicine team recommendations.  [facility]    Rebecka ApleyWebster, Otoniel Myhand P, MD 01/12/18 [phone]

## 2018-01-12 NOTE — ED Notes (Signed)
Patient becomes very combative when having brief changed. Patient was incontinent of urine, was able to be calmed down after change completed. Fresh pad placed on bed.

## 2018-01-12 NOTE — ED Notes (Signed)
Per Dr. Derrill KayGoodman, in&out cath not necessary for urinalysis d/t patient's agitation level with each cleaning/brief change.

## 2018-01-12 NOTE — ED Notes (Signed)
Unable to assess SI/HI/AVH. Patient has a one to one sitter. Patient currently in bed resting.

## 2018-01-12 NOTE — ED Notes (Signed)
Pt is currently resting in bed with eyes closed.

## 2018-01-13 MED ORDER — LORAZEPAM 0.5 MG PO TABS
0.5000 mg | ORAL_TABLET | Freq: Four times a day (QID) | ORAL | Status: DC | PRN
  Administered 2018-01-13: 0.5 mg via ORAL

## 2018-01-13 MED ORDER — LORAZEPAM 0.5 MG PO TABS
ORAL_TABLET | ORAL | Status: AC
  Administered 2018-01-13: 0.5 mg via ORAL
  Filled 2018-01-13: qty 1

## 2018-01-13 NOTE — ED Provider Notes (Signed)
-----------------------------------------   7:16 AM on 01/13/2018 -----------------------------------------   Blood pressure 129/78, pulse 95, temperature 97.6 F (36.4 C), temperature source Oral, resp. rate 18, height 6' (1.829 m), weight 77.1 kg (170 lb), SpO2 100 %.  The patient had no acute events since last update.  Calm and cooperative at this time.  Disposition is pending Psychiatry/Behavioral Medicine team recommendations.     Irean HongSung, Kelicia Youtz J, MD 01/13/18 [phone]

## 2018-01-13 NOTE — ED Notes (Signed)
Patient has one on one safety sitter in room.  Pt. Has hand mittens on for safety.  Pt. Is awake.

## 2018-01-13 NOTE — ED Notes (Signed)
Pt has been restless, sitter continuing to redirect and keep pt in bed. [facility]

## 2018-01-13 NOTE — ED Notes (Signed)
PT IVC/ PENDING PLACEMENT  

## 2018-01-13 NOTE — BH Assessment (Signed)
[facility] ([phone] ext. [phone]), they are currently under Psych Diversion.

## 2018-01-13 NOTE — ED Notes (Signed)
Patient transported to CT 

## 2018-01-13 NOTE — BH Assessment (Signed)
Referral information for Psychiatric Hospitalization faxed to;   [facility]  [facility]  [facility]  [facility]  [facility]  [facility]   [facility]  [facility]  [facility]  [facility]

## 2018-01-13 NOTE — ED Notes (Signed)
Medications were crushed and put in chocolate ice-cream

## 2018-01-14 NOTE — ED Provider Notes (Signed)
[facility] Dr. Lowanda FosterBeverly Bogan.   Merrily Brittleifenbark, Daxtin Leiker, MD 01/14/18 [phone]

## 2018-01-14 NOTE — ED Notes (Signed)
Pt. Accepted at [place].

## 2018-01-14 NOTE — ED Notes (Signed)
Pt resting with eyes closed at this time, sitter at bedside. Informed sitter pt would be going to [place] today and will need to be cleaned up before leaving.

## 2018-01-14 NOTE — ED Notes (Signed)
Spoke with patient's sister Celedonio Miyamotoaulette Pride [phone] informed her that the patient was [facility].  Paulette states he has been there before and has the contact information. Pt did not have any belongings in [facility] or at the desk.

## 2018-01-14 NOTE — ED Notes (Signed)
Breakfast tray given to patient.

## 2018-01-14 NOTE — ED Notes (Signed)
Pt changed and cleaned up prior to transport to [place]. Pt assisted into wheelchair and [facility] Zenaida Niecevan for transport.  Pt needed frequently redirecting.

## 2018-01-14 NOTE — ED Provider Notes (Signed)
-----------------------------------------   8:46 AM on 01/14/2018 -----------------------------------------   Blood pressure (!) 141/82, pulse 97, temperature 97.8 F (36.6 C), temperature source Oral, resp. rate 19, height 6' (1.829 m), weight 77.1 kg (170 lb), SpO2 97 %.  Being transferred now.   Merrily Brittleifenbark, Oneika Simonian, MD 01/14/18 [phone]

## 2018-01-14 NOTE — BH Assessment (Signed)
[facility] been [facility].  Patient assigned to room 403A at [address] [place] Accepting physician is Dr. Lowanda FosterBeverly Koeller  Call report to [phone].  [place] was [place].   ER Staff is aware of it:  [facility]  Dr. Manson PasseyBrown, ER MD  [facility]

## 2018-01-14 NOTE — ED Notes (Signed)
EMTALA REVIEWED 
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
## 2018-01-15 DIAGNOSIS — N189 Chronic kidney disease, unspecified: Secondary | ICD-10-CM | POA: Diagnosis not present

## 2018-01-15 DIAGNOSIS — N289 Disorder of kidney and ureter, unspecified: Secondary | ICD-10-CM | POA: Diagnosis not present

## 2018-01-15 DIAGNOSIS — N401 Enlarged prostate with lower urinary tract symptoms: Secondary | ICD-10-CM | POA: Diagnosis not present

## 2018-01-15 DIAGNOSIS — R338 Other retention of urine: Secondary | ICD-10-CM | POA: Diagnosis not present

## 2018-01-17 DIAGNOSIS — N289 Disorder of kidney and ureter, unspecified: Secondary | ICD-10-CM | POA: Diagnosis not present

## 2018-01-17 DIAGNOSIS — N401 Enlarged prostate with lower urinary tract symptoms: Secondary | ICD-10-CM | POA: Diagnosis not present

## 2018-01-17 DIAGNOSIS — R338 Other retention of urine: Secondary | ICD-10-CM | POA: Diagnosis not present

## 2018-01-17 DIAGNOSIS — N189 Chronic kidney disease, unspecified: Secondary | ICD-10-CM | POA: Diagnosis not present

## 2018-01-18 DIAGNOSIS — R338 Other retention of urine: Secondary | ICD-10-CM | POA: Diagnosis not present

## 2018-01-18 DIAGNOSIS — N289 Disorder of kidney and ureter, unspecified: Secondary | ICD-10-CM | POA: Diagnosis not present

## 2018-01-18 DIAGNOSIS — N401 Enlarged prostate with lower urinary tract symptoms: Secondary | ICD-10-CM | POA: Diagnosis not present

## 2018-01-18 DIAGNOSIS — E119 Type 2 diabetes mellitus without complications: Secondary | ICD-10-CM | POA: Diagnosis not present

## 2018-01-19 DIAGNOSIS — R338 Other retention of urine: Secondary | ICD-10-CM | POA: Diagnosis not present

## 2018-01-19 DIAGNOSIS — E119 Type 2 diabetes mellitus without complications: Secondary | ICD-10-CM | POA: Diagnosis not present

## 2018-01-19 DIAGNOSIS — E559 Vitamin D deficiency, unspecified: Secondary | ICD-10-CM | POA: Diagnosis not present

## 2018-01-19 DIAGNOSIS — N289 Disorder of kidney and ureter, unspecified: Secondary | ICD-10-CM | POA: Diagnosis not present

## 2018-01-20 DIAGNOSIS — N289 Disorder of kidney and ureter, unspecified: Secondary | ICD-10-CM | POA: Diagnosis not present

## 2018-01-20 DIAGNOSIS — R338 Other retention of urine: Secondary | ICD-10-CM | POA: Diagnosis not present

## 2018-01-20 DIAGNOSIS — E119 Type 2 diabetes mellitus without complications: Secondary | ICD-10-CM | POA: Diagnosis not present

## 2018-01-20 DIAGNOSIS — E559 Vitamin D deficiency, unspecified: Secondary | ICD-10-CM | POA: Diagnosis not present

## 2018-01-21 DIAGNOSIS — E559 Vitamin D deficiency, unspecified: Secondary | ICD-10-CM | POA: Diagnosis not present

## 2018-01-21 DIAGNOSIS — N289 Disorder of kidney and ureter, unspecified: Secondary | ICD-10-CM | POA: Diagnosis not present

## 2018-01-21 DIAGNOSIS — E119 Type 2 diabetes mellitus without complications: Secondary | ICD-10-CM | POA: Diagnosis not present

## 2018-01-21 DIAGNOSIS — R338 Other retention of urine: Secondary | ICD-10-CM | POA: Diagnosis not present

## 2018-01-22 DIAGNOSIS — N289 Disorder of kidney and ureter, unspecified: Secondary | ICD-10-CM | POA: Diagnosis not present

## 2018-01-22 DIAGNOSIS — R338 Other retention of urine: Secondary | ICD-10-CM | POA: Diagnosis not present

## 2018-01-22 DIAGNOSIS — E119 Type 2 diabetes mellitus without complications: Secondary | ICD-10-CM | POA: Diagnosis not present

## 2018-01-22 DIAGNOSIS — E559 Vitamin D deficiency, unspecified: Secondary | ICD-10-CM | POA: Diagnosis not present

## 2018-01-23 DIAGNOSIS — E559 Vitamin D deficiency, unspecified: Secondary | ICD-10-CM | POA: Diagnosis not present

## 2018-01-23 DIAGNOSIS — R338 Other retention of urine: Secondary | ICD-10-CM | POA: Diagnosis not present

## 2018-01-23 DIAGNOSIS — E119 Type 2 diabetes mellitus without complications: Secondary | ICD-10-CM | POA: Diagnosis not present

## 2018-01-23 DIAGNOSIS — N289 Disorder of kidney and ureter, unspecified: Secondary | ICD-10-CM | POA: Diagnosis not present

## 2018-01-24 DIAGNOSIS — E119 Type 2 diabetes mellitus without complications: Secondary | ICD-10-CM | POA: Diagnosis not present

## 2018-01-24 DIAGNOSIS — E559 Vitamin D deficiency, unspecified: Secondary | ICD-10-CM | POA: Diagnosis not present

## 2018-01-24 DIAGNOSIS — N289 Disorder of kidney and ureter, unspecified: Secondary | ICD-10-CM | POA: Diagnosis not present

## 2018-01-24 DIAGNOSIS — R338 Other retention of urine: Secondary | ICD-10-CM | POA: Diagnosis not present

## 2018-01-25 DIAGNOSIS — R8290 Unspecified abnormal findings in urine: Secondary | ICD-10-CM | POA: Diagnosis not present

## 2018-01-25 DIAGNOSIS — N401 Enlarged prostate with lower urinary tract symptoms: Secondary | ICD-10-CM | POA: Diagnosis not present

## 2018-01-25 DIAGNOSIS — E1159 Type 2 diabetes mellitus with other circulatory complications: Secondary | ICD-10-CM | POA: Diagnosis not present

## 2018-01-25 DIAGNOSIS — L89312 Pressure ulcer of right buttock, stage 2: Secondary | ICD-10-CM | POA: Diagnosis not present

## 2018-01-25 DIAGNOSIS — N182 Chronic kidney disease, stage 2 (mild): Secondary | ICD-10-CM | POA: Diagnosis not present

## 2018-01-27 DIAGNOSIS — I10 Essential (primary) hypertension: Secondary | ICD-10-CM | POA: Diagnosis not present

## 2018-01-27 DIAGNOSIS — E1122 Type 2 diabetes mellitus with diabetic chronic kidney disease: Secondary | ICD-10-CM | POA: Diagnosis not present

## 2018-01-27 DIAGNOSIS — E559 Vitamin D deficiency, unspecified: Secondary | ICD-10-CM | POA: Diagnosis not present

## 2018-01-27 DIAGNOSIS — E1159 Type 2 diabetes mellitus with other circulatory complications: Secondary | ICD-10-CM | POA: Diagnosis not present

## 2018-01-27 DIAGNOSIS — R8290 Unspecified abnormal findings in urine: Secondary | ICD-10-CM | POA: Diagnosis not present

## 2018-01-28 DIAGNOSIS — R8290 Unspecified abnormal findings in urine: Secondary | ICD-10-CM | POA: Diagnosis not present

## 2018-01-28 DIAGNOSIS — L89312 Pressure ulcer of right buttock, stage 2: Secondary | ICD-10-CM | POA: Diagnosis not present

## 2018-01-28 DIAGNOSIS — Z87820 Personal history of traumatic brain injury: Secondary | ICD-10-CM | POA: Diagnosis not present

## 2018-01-28 DIAGNOSIS — Z515 Encounter for palliative care: Secondary | ICD-10-CM | POA: Diagnosis not present

## 2018-01-28 DIAGNOSIS — N401 Enlarged prostate with lower urinary tract symptoms: Secondary | ICD-10-CM | POA: Diagnosis not present

## 2018-01-28 DIAGNOSIS — N289 Disorder of kidney and ureter, unspecified: Secondary | ICD-10-CM | POA: Diagnosis not present

## 2018-01-28 DIAGNOSIS — E1159 Type 2 diabetes mellitus with other circulatory complications: Secondary | ICD-10-CM | POA: Diagnosis not present

## 2018-01-28 DIAGNOSIS — N182 Chronic kidney disease, stage 2 (mild): Secondary | ICD-10-CM | POA: Diagnosis not present

## 2018-01-29 DIAGNOSIS — R338 Other retention of urine: Secondary | ICD-10-CM | POA: Diagnosis not present

## 2018-01-29 DIAGNOSIS — Z515 Encounter for palliative care: Secondary | ICD-10-CM | POA: Diagnosis not present

## 2018-01-30 DIAGNOSIS — N182 Chronic kidney disease, stage 2 (mild): Secondary | ICD-10-CM | POA: Diagnosis not present

## 2018-01-30 DIAGNOSIS — E1122 Type 2 diabetes mellitus with diabetic chronic kidney disease: Secondary | ICD-10-CM | POA: Diagnosis not present

## 2018-01-30 DIAGNOSIS — N401 Enlarged prostate with lower urinary tract symptoms: Secondary | ICD-10-CM | POA: Diagnosis not present

## 2018-01-30 DIAGNOSIS — E1159 Type 2 diabetes mellitus with other circulatory complications: Secondary | ICD-10-CM | POA: Diagnosis not present

## 2018-01-30 DIAGNOSIS — R8290 Unspecified abnormal findings in urine: Secondary | ICD-10-CM | POA: Diagnosis not present

## 2018-01-31 DIAGNOSIS — R338 Other retention of urine: Secondary | ICD-10-CM | POA: Diagnosis not present

## 2018-01-31 DIAGNOSIS — K5900 Constipation, unspecified: Secondary | ICD-10-CM | POA: Diagnosis not present

## 2018-02-01 DIAGNOSIS — R338 Other retention of urine: Secondary | ICD-10-CM | POA: Diagnosis not present

## 2018-02-01 DIAGNOSIS — E1122 Type 2 diabetes mellitus with diabetic chronic kidney disease: Secondary | ICD-10-CM | POA: Diagnosis not present

## 2018-02-01 DIAGNOSIS — L89312 Pressure ulcer of right buttock, stage 2: Secondary | ICD-10-CM | POA: Diagnosis not present

## 2018-02-01 DIAGNOSIS — N401 Enlarged prostate with lower urinary tract symptoms: Secondary | ICD-10-CM | POA: Diagnosis not present

## 2018-02-02 DIAGNOSIS — E1122 Type 2 diabetes mellitus with diabetic chronic kidney disease: Secondary | ICD-10-CM | POA: Diagnosis not present

## 2018-02-02 DIAGNOSIS — N401 Enlarged prostate with lower urinary tract symptoms: Secondary | ICD-10-CM | POA: Diagnosis not present

## 2018-02-02 DIAGNOSIS — L89312 Pressure ulcer of right buttock, stage 2: Secondary | ICD-10-CM | POA: Diagnosis not present

## 2018-02-02 DIAGNOSIS — R338 Other retention of urine: Secondary | ICD-10-CM | POA: Diagnosis not present

## 2018-02-03 DIAGNOSIS — N289 Disorder of kidney and ureter, unspecified: Secondary | ICD-10-CM | POA: Diagnosis not present

## 2018-02-03 DIAGNOSIS — R338 Other retention of urine: Secondary | ICD-10-CM | POA: Diagnosis not present

## 2018-02-03 DIAGNOSIS — Z7189 Other specified counseling: Secondary | ICD-10-CM | POA: Diagnosis not present

## 2018-02-03 DIAGNOSIS — E1122 Type 2 diabetes mellitus with diabetic chronic kidney disease: Secondary | ICD-10-CM | POA: Diagnosis not present

## 2018-02-03 DIAGNOSIS — N401 Enlarged prostate with lower urinary tract symptoms: Secondary | ICD-10-CM | POA: Diagnosis not present

## 2018-02-03 DIAGNOSIS — L89312 Pressure ulcer of right buttock, stage 2: Secondary | ICD-10-CM | POA: Diagnosis not present

## 2018-02-03 DIAGNOSIS — Z87820 Personal history of traumatic brain injury: Secondary | ICD-10-CM | POA: Diagnosis not present

## 2018-02-04 DIAGNOSIS — N401 Enlarged prostate with lower urinary tract symptoms: Secondary | ICD-10-CM | POA: Diagnosis not present

## 2018-02-04 DIAGNOSIS — E1122 Type 2 diabetes mellitus with diabetic chronic kidney disease: Secondary | ICD-10-CM | POA: Diagnosis not present

## 2018-02-04 DIAGNOSIS — K5900 Constipation, unspecified: Secondary | ICD-10-CM | POA: Diagnosis not present

## 2018-02-04 DIAGNOSIS — E1159 Type 2 diabetes mellitus with other circulatory complications: Secondary | ICD-10-CM | POA: Diagnosis not present

## 2018-02-06 DIAGNOSIS — N401 Enlarged prostate with lower urinary tract symptoms: Secondary | ICD-10-CM | POA: Diagnosis not present

## 2018-02-06 DIAGNOSIS — K5900 Constipation, unspecified: Secondary | ICD-10-CM | POA: Diagnosis not present

## 2018-02-06 DIAGNOSIS — L89312 Pressure ulcer of right buttock, stage 2: Secondary | ICD-10-CM | POA: Diagnosis not present

## 2018-02-06 DIAGNOSIS — E1159 Type 2 diabetes mellitus with other circulatory complications: Secondary | ICD-10-CM | POA: Diagnosis not present

## 2018-02-06 IMAGING — CT CT ABD-PELV W/ CM
2 of 5 series · 16 of 46 positions shown, 18 images · IV contrast (iopamidol)
Comparison: None.

CLINICAL DATA: Abdominal pain, bloody stools.

EXAM:
CT ABDOMEN AND PELVIS WITH CONTRAST
TECHNIQUE: Multidetector CT imaging of the abdomen and pelvis was performed
using the standard protocol following bolus administration of
intravenous contrast.
CONTRAST:  100mL [id number] IOPAMIDOL ([id number]) INJECTION 61%

[Series 2: routine abd/pel with · axial · 0.75mm/px · z∈[-983,-518]mm · 13 of 105 slices shown, 15 images]
[im 6/105  soft-tissue]
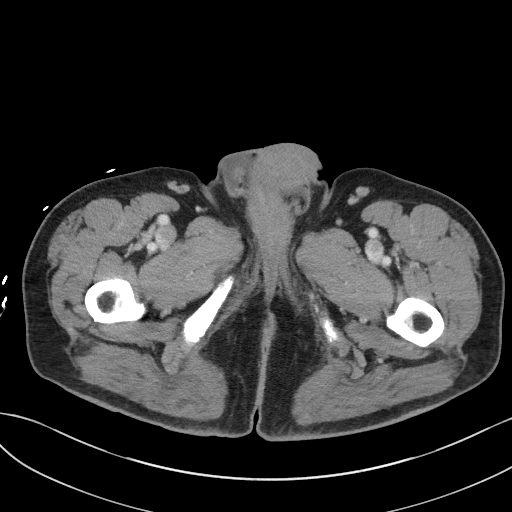
[im 6/105  bone]
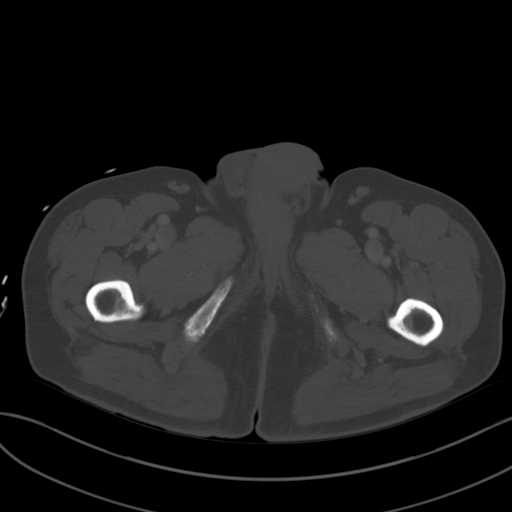
[im 17/105  soft-tissue]
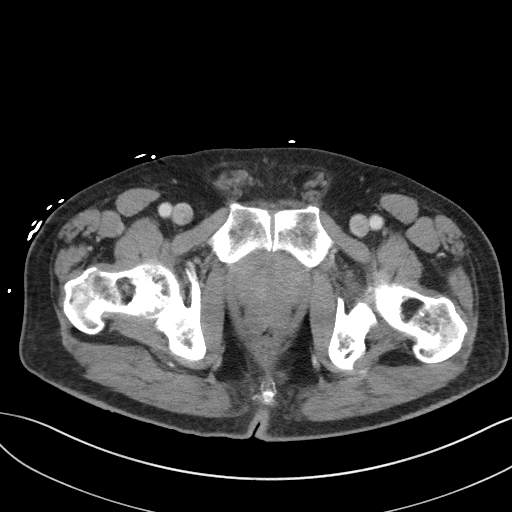
[im 22/105  soft-tissue]
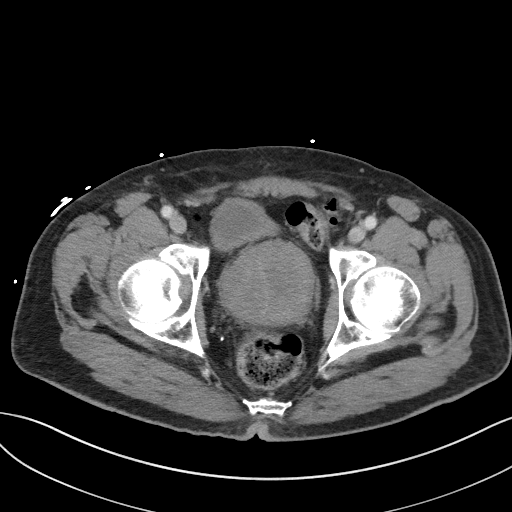
[im 28/105  soft-tissue]
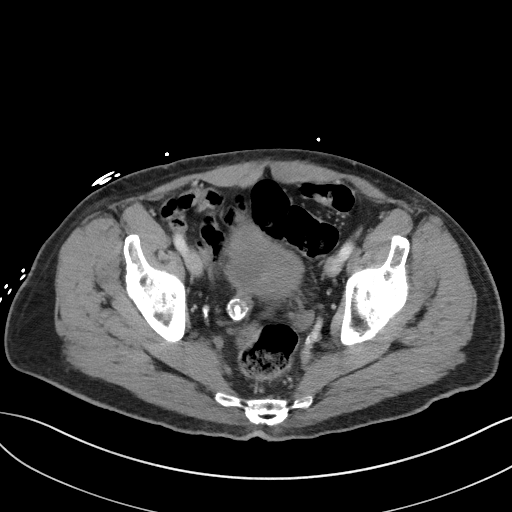
[im 39/105  soft-tissue]
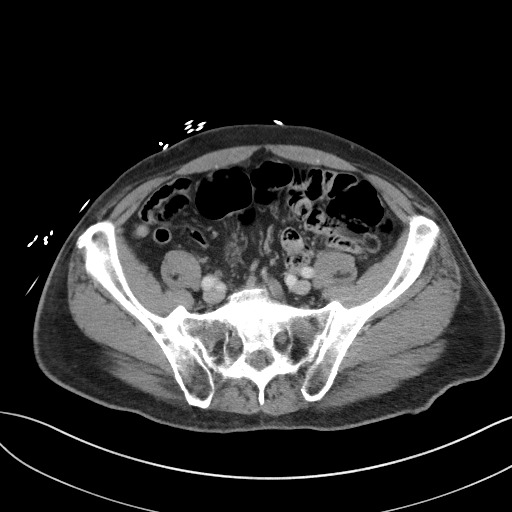
[im 44/105  soft-tissue]
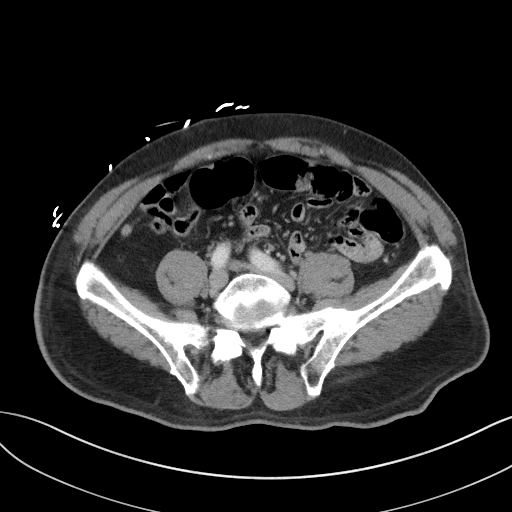
[im 55/105  soft-tissue]
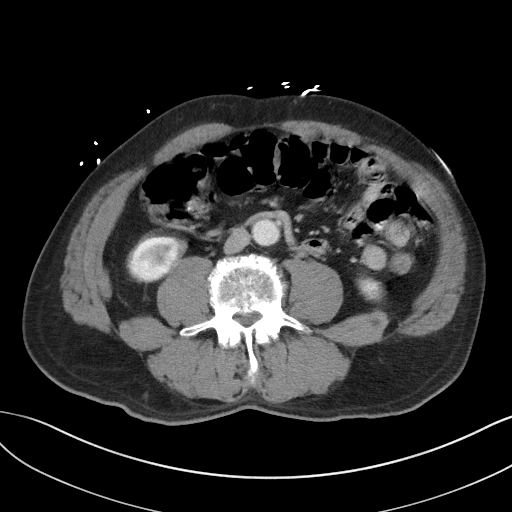
[im 61/105  soft-tissue]
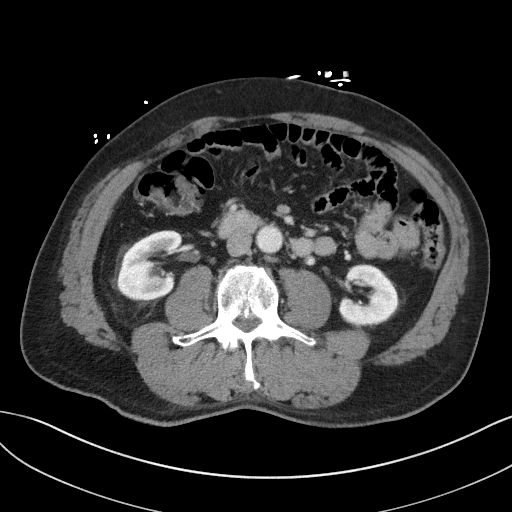
[im 66/105  soft-tissue]
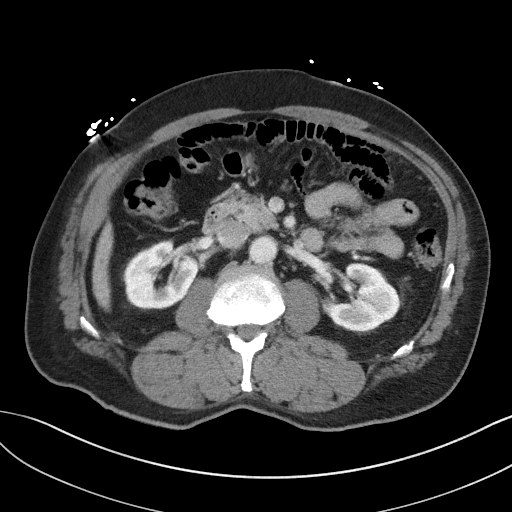
[im 66/105  bone]
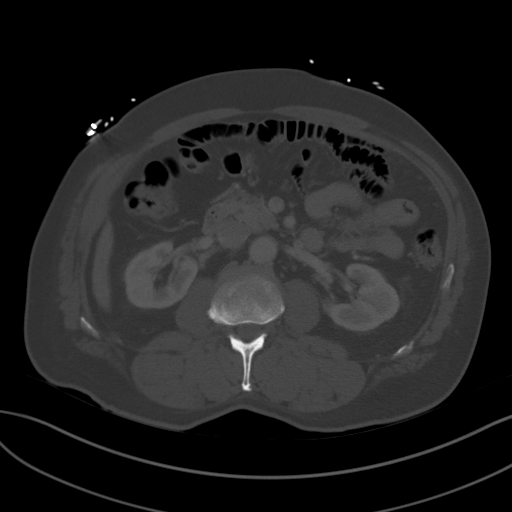
[im 77/105  soft-tissue]
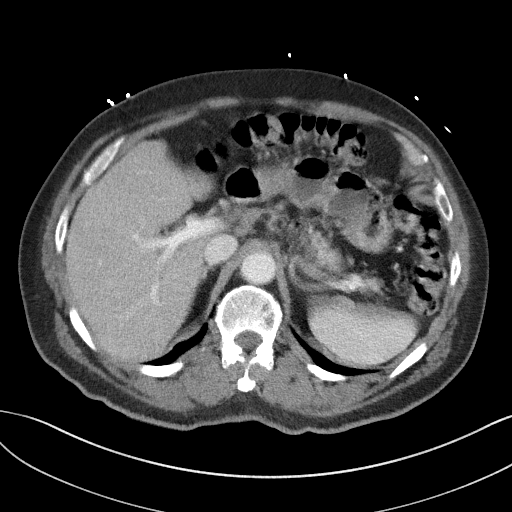
[im 83/105  soft-tissue]
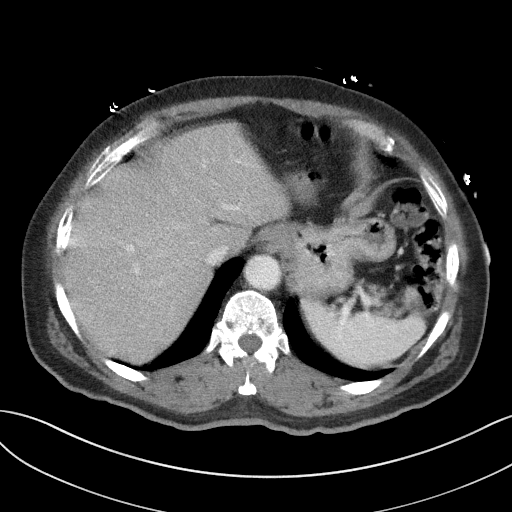
[im 88/105  soft-tissue]
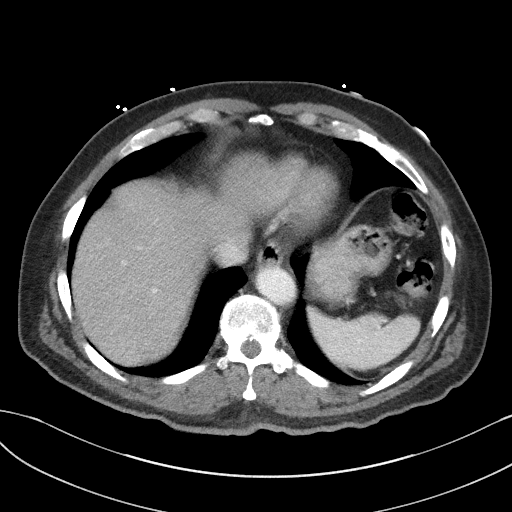
[im 99/105  soft-tissue]
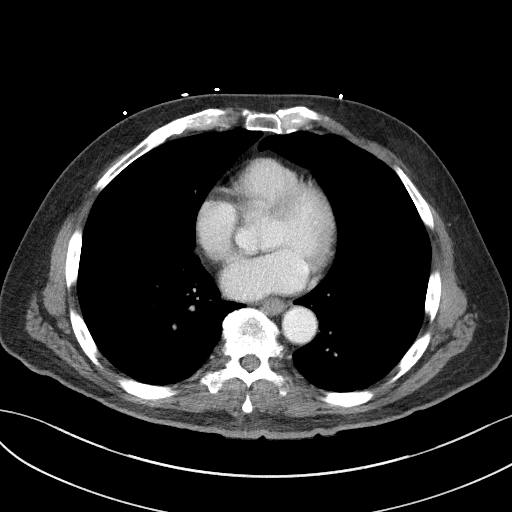

[Series 5: coronal st · coronal · 0.74mm/px · 3 of 91 slices shown]
[im 31/91  soft-tissue]
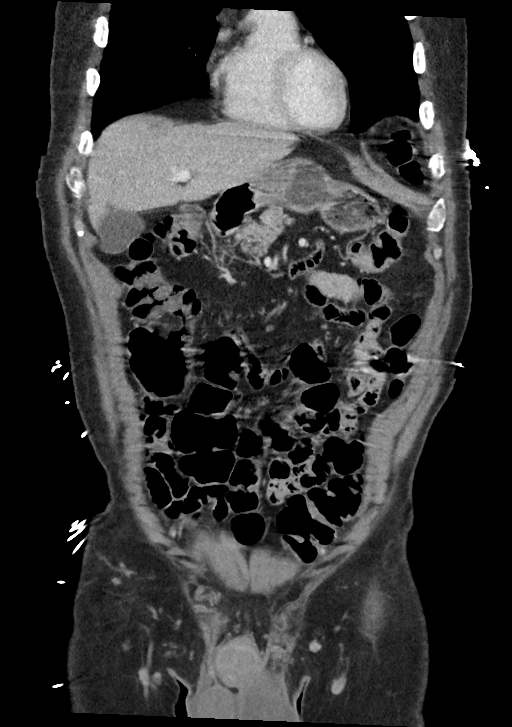
[im 41/91  soft-tissue]
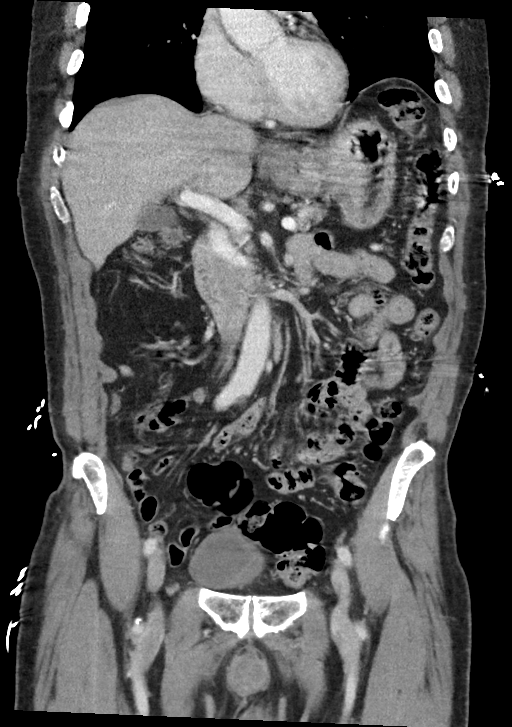
[im 51/91  soft-tissue]
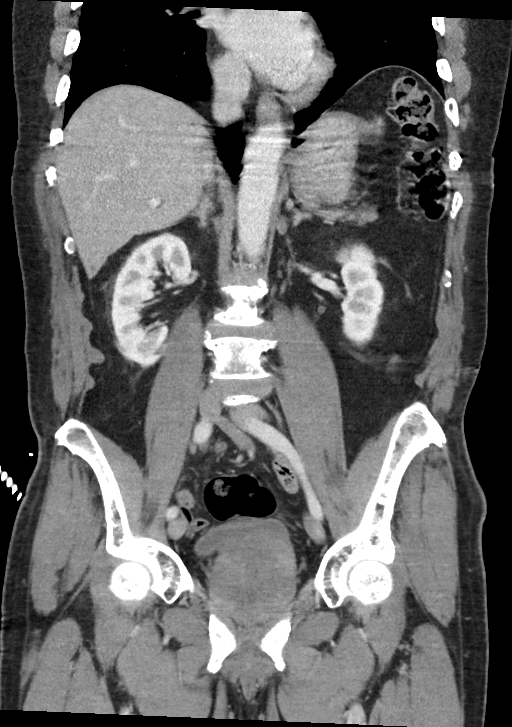

[16 of 46 positions shown; findings below may reference images not displayed]

FINDINGS: Lower chest: Lung bases are clear. No effusions. Heart is normal
size.

Hepatobiliary: No focal hepatic abnormality. Gallbladder
unremarkable.

Pancreas: No focal abnormality or ductal dilatation.

Spleen: No focal abnormality.  Normal size.

Adrenals/Urinary Tract: No adrenal abnormality. No focal renal
abnormality. There is a 16 mm laminated appearing calcification in
the right lower pelvis. It is difficult to follow the decompressed
right ureter in the pelvis, but this may be a distal right ureteral
stone. Urinary bladder is decompressed, unremarkable. No
hydronephrosis or renal stones.

Stomach/Bowel: Normal appendix. Stomach, large and small bowel
grossly unremarkable.

Vascular/Lymphatic: No evidence of aneurysm or adenopathy.

Reproductive: Marked enlargement of the prostate, 7.1 x 6.6 cm.

Other: No free fluid or free air. Small bilateral inguinal hernias
containing fat. Bilateral hydroceles noted.

Musculoskeletal: No acute bony abnormality.
IMPRESSION: Large (15 mm) laminated calcification in the right pelvis posterior
to the right bladder wall. This may reflect the distal right
ureteral stone. There is no evidence of hydronephrosis/obstruction.

Markedly enlarged prostate.

## 2018-02-08 DIAGNOSIS — E1159 Type 2 diabetes mellitus with other circulatory complications: Secondary | ICD-10-CM | POA: Diagnosis not present

## 2018-02-08 DIAGNOSIS — D649 Anemia, unspecified: Secondary | ICD-10-CM | POA: Diagnosis not present

## 2018-02-08 DIAGNOSIS — R338 Other retention of urine: Secondary | ICD-10-CM | POA: Diagnosis not present

## 2018-02-08 DIAGNOSIS — R8290 Unspecified abnormal findings in urine: Secondary | ICD-10-CM | POA: Diagnosis not present

## 2018-02-10 DIAGNOSIS — N401 Enlarged prostate with lower urinary tract symptoms: Secondary | ICD-10-CM | POA: Diagnosis not present

## 2018-02-10 DIAGNOSIS — R1311 Dysphagia, oral phase: Secondary | ICD-10-CM | POA: Diagnosis not present

## 2018-02-10 DIAGNOSIS — N182 Chronic kidney disease, stage 2 (mild): Secondary | ICD-10-CM | POA: Diagnosis not present

## 2018-02-10 DIAGNOSIS — K5900 Constipation, unspecified: Secondary | ICD-10-CM | POA: Diagnosis not present

## 2018-02-10 DIAGNOSIS — E1159 Type 2 diabetes mellitus with other circulatory complications: Secondary | ICD-10-CM | POA: Diagnosis not present

## 2018-02-12 DIAGNOSIS — E1159 Type 2 diabetes mellitus with other circulatory complications: Secondary | ICD-10-CM | POA: Diagnosis not present

## 2018-02-12 DIAGNOSIS — Z87820 Personal history of traumatic brain injury: Secondary | ICD-10-CM | POA: Diagnosis not present

## 2018-02-12 DIAGNOSIS — N401 Enlarged prostate with lower urinary tract symptoms: Secondary | ICD-10-CM | POA: Diagnosis not present

## 2018-02-12 DIAGNOSIS — N289 Disorder of kidney and ureter, unspecified: Secondary | ICD-10-CM | POA: Diagnosis not present

## 2018-02-12 DIAGNOSIS — Z7189 Other specified counseling: Secondary | ICD-10-CM | POA: Diagnosis not present

## 2018-02-12 DIAGNOSIS — K5900 Constipation, unspecified: Secondary | ICD-10-CM | POA: Diagnosis not present

## 2018-02-12 DIAGNOSIS — L89312 Pressure ulcer of right buttock, stage 2: Secondary | ICD-10-CM | POA: Diagnosis not present

## 2018-02-12 DIAGNOSIS — R1311 Dysphagia, oral phase: Secondary | ICD-10-CM | POA: Diagnosis not present

## 2018-02-12 DIAGNOSIS — N182 Chronic kidney disease, stage 2 (mild): Secondary | ICD-10-CM | POA: Diagnosis not present

## 2018-02-14 DIAGNOSIS — E1159 Type 2 diabetes mellitus with other circulatory complications: Secondary | ICD-10-CM | POA: Diagnosis not present

## 2018-02-14 DIAGNOSIS — L89312 Pressure ulcer of right buttock, stage 2: Secondary | ICD-10-CM | POA: Diagnosis not present

## 2018-02-14 DIAGNOSIS — E1122 Type 2 diabetes mellitus with diabetic chronic kidney disease: Secondary | ICD-10-CM | POA: Diagnosis not present

## 2018-02-14 DIAGNOSIS — N401 Enlarged prostate with lower urinary tract symptoms: Secondary | ICD-10-CM | POA: Diagnosis not present

## 2018-02-14 DIAGNOSIS — K5900 Constipation, unspecified: Secondary | ICD-10-CM | POA: Diagnosis not present

## 2018-02-15 DIAGNOSIS — E1122 Type 2 diabetes mellitus with diabetic chronic kidney disease: Secondary | ICD-10-CM | POA: Diagnosis not present

## 2018-02-15 DIAGNOSIS — K5900 Constipation, unspecified: Secondary | ICD-10-CM | POA: Diagnosis not present

## 2018-02-15 DIAGNOSIS — N401 Enlarged prostate with lower urinary tract symptoms: Secondary | ICD-10-CM | POA: Diagnosis not present

## 2018-02-15 DIAGNOSIS — L89312 Pressure ulcer of right buttock, stage 2: Secondary | ICD-10-CM | POA: Diagnosis not present

## 2018-02-16 DIAGNOSIS — K5900 Constipation, unspecified: Secondary | ICD-10-CM | POA: Diagnosis not present

## 2018-02-16 DIAGNOSIS — N401 Enlarged prostate with lower urinary tract symptoms: Secondary | ICD-10-CM | POA: Diagnosis not present

## 2018-02-16 DIAGNOSIS — E1122 Type 2 diabetes mellitus with diabetic chronic kidney disease: Secondary | ICD-10-CM | POA: Diagnosis not present

## 2018-02-16 DIAGNOSIS — L89312 Pressure ulcer of right buttock, stage 2: Secondary | ICD-10-CM | POA: Diagnosis not present

## 2018-02-17 DIAGNOSIS — N401 Enlarged prostate with lower urinary tract symptoms: Secondary | ICD-10-CM | POA: Diagnosis not present

## 2018-02-17 DIAGNOSIS — E1122 Type 2 diabetes mellitus with diabetic chronic kidney disease: Secondary | ICD-10-CM | POA: Diagnosis not present

## 2018-02-17 DIAGNOSIS — L89312 Pressure ulcer of right buttock, stage 2: Secondary | ICD-10-CM | POA: Diagnosis not present

## 2018-02-17 DIAGNOSIS — K5900 Constipation, unspecified: Secondary | ICD-10-CM | POA: Diagnosis not present

## 2018-02-18 DIAGNOSIS — E1122 Type 2 diabetes mellitus with diabetic chronic kidney disease: Secondary | ICD-10-CM | POA: Diagnosis not present

## 2018-02-18 DIAGNOSIS — K5900 Constipation, unspecified: Secondary | ICD-10-CM | POA: Diagnosis not present

## 2018-02-18 DIAGNOSIS — N401 Enlarged prostate with lower urinary tract symptoms: Secondary | ICD-10-CM | POA: Diagnosis not present

## 2018-02-18 DIAGNOSIS — L89312 Pressure ulcer of right buttock, stage 2: Secondary | ICD-10-CM | POA: Diagnosis not present

## 2018-02-20 DIAGNOSIS — L89312 Pressure ulcer of right buttock, stage 2: Secondary | ICD-10-CM | POA: Diagnosis not present

## 2018-02-20 DIAGNOSIS — N401 Enlarged prostate with lower urinary tract symptoms: Secondary | ICD-10-CM | POA: Diagnosis not present

## 2018-02-20 DIAGNOSIS — E1122 Type 2 diabetes mellitus with diabetic chronic kidney disease: Secondary | ICD-10-CM | POA: Diagnosis not present

## 2018-02-20 DIAGNOSIS — K5900 Constipation, unspecified: Secondary | ICD-10-CM | POA: Diagnosis not present

## 2018-02-21 DIAGNOSIS — N401 Enlarged prostate with lower urinary tract symptoms: Secondary | ICD-10-CM | POA: Diagnosis not present

## 2018-02-21 DIAGNOSIS — E1122 Type 2 diabetes mellitus with diabetic chronic kidney disease: Secondary | ICD-10-CM | POA: Diagnosis not present

## 2018-02-21 DIAGNOSIS — L89312 Pressure ulcer of right buttock, stage 2: Secondary | ICD-10-CM | POA: Diagnosis not present

## 2018-02-21 DIAGNOSIS — K5900 Constipation, unspecified: Secondary | ICD-10-CM | POA: Diagnosis not present

## 2018-02-22 DIAGNOSIS — E1122 Type 2 diabetes mellitus with diabetic chronic kidney disease: Secondary | ICD-10-CM | POA: Diagnosis not present

## 2018-02-22 DIAGNOSIS — K5900 Constipation, unspecified: Secondary | ICD-10-CM | POA: Diagnosis not present

## 2018-02-22 DIAGNOSIS — N401 Enlarged prostate with lower urinary tract symptoms: Secondary | ICD-10-CM | POA: Diagnosis not present

## 2018-02-22 DIAGNOSIS — E1159 Type 2 diabetes mellitus with other circulatory complications: Secondary | ICD-10-CM | POA: Diagnosis not present

## 2018-02-22 DIAGNOSIS — N182 Chronic kidney disease, stage 2 (mild): Secondary | ICD-10-CM | POA: Diagnosis not present

## 2018-02-23 DIAGNOSIS — K5900 Constipation, unspecified: Secondary | ICD-10-CM | POA: Diagnosis not present

## 2018-02-24 DIAGNOSIS — K5904 Chronic idiopathic constipation: Secondary | ICD-10-CM | POA: Diagnosis not present

## 2018-02-24 DIAGNOSIS — R338 Other retention of urine: Secondary | ICD-10-CM | POA: Diagnosis not present

## 2018-02-25 DIAGNOSIS — K5904 Chronic idiopathic constipation: Secondary | ICD-10-CM | POA: Diagnosis not present

## 2018-02-26 DIAGNOSIS — K5904 Chronic idiopathic constipation: Secondary | ICD-10-CM | POA: Diagnosis not present

## 2018-02-27 DIAGNOSIS — K5900 Constipation, unspecified: Secondary | ICD-10-CM | POA: Diagnosis not present

## 2018-02-27 DIAGNOSIS — K6389 Other specified diseases of intestine: Secondary | ICD-10-CM | POA: Diagnosis not present

## 2018-02-28 DIAGNOSIS — D649 Anemia, unspecified: Secondary | ICD-10-CM | POA: Diagnosis not present

## 2018-02-28 DIAGNOSIS — E1159 Type 2 diabetes mellitus with other circulatory complications: Secondary | ICD-10-CM | POA: Diagnosis not present

## 2018-02-28 DIAGNOSIS — K5900 Constipation, unspecified: Secondary | ICD-10-CM | POA: Diagnosis not present

## 2018-02-28 DIAGNOSIS — R338 Other retention of urine: Secondary | ICD-10-CM | POA: Diagnosis not present

## 2018-02-28 DIAGNOSIS — N401 Enlarged prostate with lower urinary tract symptoms: Secondary | ICD-10-CM | POA: Diagnosis not present

## 2018-03-01 DIAGNOSIS — L89312 Pressure ulcer of right buttock, stage 2: Secondary | ICD-10-CM | POA: Diagnosis not present

## 2018-03-01 DIAGNOSIS — K5900 Constipation, unspecified: Secondary | ICD-10-CM | POA: Diagnosis not present

## 2018-03-01 DIAGNOSIS — D709 Neutropenia, unspecified: Secondary | ICD-10-CM | POA: Diagnosis not present

## 2018-03-03 DIAGNOSIS — L89312 Pressure ulcer of right buttock, stage 2: Secondary | ICD-10-CM | POA: Diagnosis not present

## 2018-03-03 DIAGNOSIS — K5900 Constipation, unspecified: Secondary | ICD-10-CM | POA: Diagnosis not present

## 2018-03-03 DIAGNOSIS — D709 Neutropenia, unspecified: Secondary | ICD-10-CM | POA: Diagnosis not present

## 2018-03-04 DIAGNOSIS — L89312 Pressure ulcer of right buttock, stage 2: Secondary | ICD-10-CM | POA: Diagnosis not present

## 2018-03-04 DIAGNOSIS — R1312 Dysphagia, oropharyngeal phase: Secondary | ICD-10-CM | POA: Diagnosis not present

## 2018-03-04 DIAGNOSIS — E559 Vitamin D deficiency, unspecified: Secondary | ICD-10-CM | POA: Diagnosis not present

## 2018-03-04 DIAGNOSIS — N182 Chronic kidney disease, stage 2 (mild): Secondary | ICD-10-CM | POA: Diagnosis not present

## 2018-03-04 DIAGNOSIS — Z794 Long term (current) use of insulin: Secondary | ICD-10-CM | POA: Diagnosis not present

## 2018-03-04 DIAGNOSIS — I158 Other secondary hypertension: Secondary | ICD-10-CM | POA: Diagnosis not present

## 2018-03-04 DIAGNOSIS — R293 Abnormal posture: Secondary | ICD-10-CM | POA: Diagnosis not present

## 2018-03-04 DIAGNOSIS — D649 Anemia, unspecified: Secondary | ICD-10-CM | POA: Diagnosis not present

## 2018-03-04 DIAGNOSIS — M109 Gout, unspecified: Secondary | ICD-10-CM | POA: Diagnosis not present

## 2018-03-04 DIAGNOSIS — K5900 Constipation, unspecified: Secondary | ICD-10-CM | POA: Diagnosis not present

## 2018-03-04 DIAGNOSIS — R338 Other retention of urine: Secondary | ICD-10-CM | POA: Diagnosis not present

## 2018-03-04 DIAGNOSIS — M6250 Muscle wasting and atrophy, not elsewhere classified, unspecified site: Secondary | ICD-10-CM | POA: Diagnosis not present

## 2018-03-04 DIAGNOSIS — M6281 Muscle weakness (generalized): Secondary | ICD-10-CM | POA: Diagnosis not present

## 2018-03-04 DIAGNOSIS — Z87820 Personal history of traumatic brain injury: Secondary | ICD-10-CM | POA: Diagnosis not present

## 2018-03-04 DIAGNOSIS — E1129 Type 2 diabetes mellitus with other diabetic kidney complication: Secondary | ICD-10-CM | POA: Diagnosis not present

## 2018-03-04 DIAGNOSIS — N289 Disorder of kidney and ureter, unspecified: Secondary | ICD-10-CM | POA: Diagnosis not present

## 2018-03-04 DIAGNOSIS — I129 Hypertensive chronic kidney disease with stage 1 through stage 4 chronic kidney disease, or unspecified chronic kidney disease: Secondary | ICD-10-CM | POA: Diagnosis not present

## 2018-03-04 DIAGNOSIS — E785 Hyperlipidemia, unspecified: Secondary | ICD-10-CM | POA: Diagnosis not present

## 2018-03-04 DIAGNOSIS — I4581 Long QT syndrome: Secondary | ICD-10-CM | POA: Diagnosis not present

## 2018-03-04 DIAGNOSIS — D709 Neutropenia, unspecified: Secondary | ICD-10-CM | POA: Diagnosis not present

## 2018-03-04 DIAGNOSIS — R1311 Dysphagia, oral phase: Secondary | ICD-10-CM | POA: Diagnosis not present

## 2018-03-04 DIAGNOSIS — R278 Other lack of coordination: Secondary | ICD-10-CM | POA: Diagnosis not present

## 2018-03-05 DIAGNOSIS — Z87820 Personal history of traumatic brain injury: Secondary | ICD-10-CM | POA: Diagnosis not present

## 2018-03-11 DIAGNOSIS — I129 Hypertensive chronic kidney disease with stage 1 through stage 4 chronic kidney disease, or unspecified chronic kidney disease: Secondary | ICD-10-CM | POA: Diagnosis not present

## 2018-03-11 DIAGNOSIS — E785 Hyperlipidemia, unspecified: Secondary | ICD-10-CM | POA: Diagnosis not present

## 2018-03-30 DIAGNOSIS — E1129 Type 2 diabetes mellitus with other diabetic kidney complication: Secondary | ICD-10-CM | POA: Diagnosis not present

## 2018-03-30 DIAGNOSIS — R278 Other lack of coordination: Secondary | ICD-10-CM | POA: Diagnosis not present

## 2018-03-30 DIAGNOSIS — M6250 Muscle wasting and atrophy, not elsewhere classified, unspecified site: Secondary | ICD-10-CM | POA: Diagnosis not present

## 2018-03-30 DIAGNOSIS — R1312 Dysphagia, oropharyngeal phase: Secondary | ICD-10-CM | POA: Diagnosis not present

## 2018-03-30 DIAGNOSIS — M6281 Muscle weakness (generalized): Secondary | ICD-10-CM | POA: Diagnosis not present

## 2018-03-30 DIAGNOSIS — R293 Abnormal posture: Secondary | ICD-10-CM | POA: Diagnosis not present

## 2018-03-31 DIAGNOSIS — D649 Anemia, unspecified: Secondary | ICD-10-CM | POA: Diagnosis not present

## 2018-03-31 DIAGNOSIS — E559 Vitamin D deficiency, unspecified: Secondary | ICD-10-CM | POA: Diagnosis not present

## 2018-04-01 DIAGNOSIS — M6281 Muscle weakness (generalized): Secondary | ICD-10-CM | POA: Diagnosis not present

## 2018-04-01 DIAGNOSIS — R1312 Dysphagia, oropharyngeal phase: Secondary | ICD-10-CM | POA: Diagnosis not present

## 2018-04-01 DIAGNOSIS — I129 Hypertensive chronic kidney disease with stage 1 through stage 4 chronic kidney disease, or unspecified chronic kidney disease: Secondary | ICD-10-CM | POA: Diagnosis not present

## 2018-04-01 DIAGNOSIS — R278 Other lack of coordination: Secondary | ICD-10-CM | POA: Diagnosis not present

## 2018-04-01 DIAGNOSIS — E1122 Type 2 diabetes mellitus with diabetic chronic kidney disease: Secondary | ICD-10-CM | POA: Diagnosis not present

## 2018-04-01 DIAGNOSIS — E1129 Type 2 diabetes mellitus with other diabetic kidney complication: Secondary | ICD-10-CM | POA: Diagnosis not present

## 2018-04-01 DIAGNOSIS — R293 Abnormal posture: Secondary | ICD-10-CM | POA: Diagnosis not present

## 2018-04-01 DIAGNOSIS — M6250 Muscle wasting and atrophy, not elsewhere classified, unspecified site: Secondary | ICD-10-CM | POA: Diagnosis not present

## 2018-04-05 DIAGNOSIS — R488 Other symbolic dysfunctions: Secondary | ICD-10-CM | POA: Diagnosis not present

## 2018-04-05 DIAGNOSIS — R278 Other lack of coordination: Secondary | ICD-10-CM | POA: Diagnosis not present

## 2018-04-05 DIAGNOSIS — M6281 Muscle weakness (generalized): Secondary | ICD-10-CM | POA: Diagnosis not present

## 2018-04-05 DIAGNOSIS — R1312 Dysphagia, oropharyngeal phase: Secondary | ICD-10-CM | POA: Diagnosis not present

## 2018-04-05 DIAGNOSIS — M6250 Muscle wasting and atrophy, not elsewhere classified, unspecified site: Secondary | ICD-10-CM | POA: Diagnosis not present

## 2018-04-05 DIAGNOSIS — E1129 Type 2 diabetes mellitus with other diabetic kidney complication: Secondary | ICD-10-CM | POA: Diagnosis not present

## 2018-04-05 DIAGNOSIS — R293 Abnormal posture: Secondary | ICD-10-CM | POA: Diagnosis not present

## 2018-04-07 DIAGNOSIS — E1129 Type 2 diabetes mellitus with other diabetic kidney complication: Secondary | ICD-10-CM | POA: Diagnosis not present

## 2018-04-07 DIAGNOSIS — M6250 Muscle wasting and atrophy, not elsewhere classified, unspecified site: Secondary | ICD-10-CM | POA: Diagnosis not present

## 2018-04-07 DIAGNOSIS — R1312 Dysphagia, oropharyngeal phase: Secondary | ICD-10-CM | POA: Diagnosis not present

## 2018-04-07 DIAGNOSIS — R293 Abnormal posture: Secondary | ICD-10-CM | POA: Diagnosis not present

## 2018-04-07 DIAGNOSIS — R278 Other lack of coordination: Secondary | ICD-10-CM | POA: Diagnosis not present

## 2018-04-07 DIAGNOSIS — M6281 Muscle weakness (generalized): Secondary | ICD-10-CM | POA: Diagnosis not present

## 2018-04-07 DIAGNOSIS — R488 Other symbolic dysfunctions: Secondary | ICD-10-CM | POA: Diagnosis not present

## 2018-04-09 DIAGNOSIS — R1312 Dysphagia, oropharyngeal phase: Secondary | ICD-10-CM | POA: Diagnosis not present

## 2018-04-09 DIAGNOSIS — M6281 Muscle weakness (generalized): Secondary | ICD-10-CM | POA: Diagnosis not present

## 2018-04-09 DIAGNOSIS — E1129 Type 2 diabetes mellitus with other diabetic kidney complication: Secondary | ICD-10-CM | POA: Diagnosis not present

## 2018-04-09 DIAGNOSIS — R488 Other symbolic dysfunctions: Secondary | ICD-10-CM | POA: Diagnosis not present

## 2018-04-09 DIAGNOSIS — R278 Other lack of coordination: Secondary | ICD-10-CM | POA: Diagnosis not present

## 2018-04-09 DIAGNOSIS — R293 Abnormal posture: Secondary | ICD-10-CM | POA: Diagnosis not present

## 2018-04-09 DIAGNOSIS — M6250 Muscle wasting and atrophy, not elsewhere classified, unspecified site: Secondary | ICD-10-CM | POA: Diagnosis not present

## 2018-04-12 DIAGNOSIS — M6281 Muscle weakness (generalized): Secondary | ICD-10-CM | POA: Diagnosis not present

## 2018-04-12 DIAGNOSIS — R488 Other symbolic dysfunctions: Secondary | ICD-10-CM | POA: Diagnosis not present

## 2018-04-12 DIAGNOSIS — R278 Other lack of coordination: Secondary | ICD-10-CM | POA: Diagnosis not present

## 2018-04-12 DIAGNOSIS — R293 Abnormal posture: Secondary | ICD-10-CM | POA: Diagnosis not present

## 2018-04-12 DIAGNOSIS — E1129 Type 2 diabetes mellitus with other diabetic kidney complication: Secondary | ICD-10-CM | POA: Diagnosis not present

## 2018-04-12 DIAGNOSIS — R1312 Dysphagia, oropharyngeal phase: Secondary | ICD-10-CM | POA: Diagnosis not present

## 2018-04-12 DIAGNOSIS — M6250 Muscle wasting and atrophy, not elsewhere classified, unspecified site: Secondary | ICD-10-CM | POA: Diagnosis not present

## 2018-04-16 DIAGNOSIS — R1312 Dysphagia, oropharyngeal phase: Secondary | ICD-10-CM | POA: Diagnosis not present

## 2018-04-16 DIAGNOSIS — R293 Abnormal posture: Secondary | ICD-10-CM | POA: Diagnosis not present

## 2018-04-16 DIAGNOSIS — M6250 Muscle wasting and atrophy, not elsewhere classified, unspecified site: Secondary | ICD-10-CM | POA: Diagnosis not present

## 2018-04-16 DIAGNOSIS — M6281 Muscle weakness (generalized): Secondary | ICD-10-CM | POA: Diagnosis not present

## 2018-04-16 DIAGNOSIS — R488 Other symbolic dysfunctions: Secondary | ICD-10-CM | POA: Diagnosis not present

## 2018-04-16 DIAGNOSIS — E1129 Type 2 diabetes mellitus with other diabetic kidney complication: Secondary | ICD-10-CM | POA: Diagnosis not present

## 2018-04-16 DIAGNOSIS — R278 Other lack of coordination: Secondary | ICD-10-CM | POA: Diagnosis not present

## 2018-04-18 DIAGNOSIS — S0083XA Contusion of other part of head, initial encounter: Secondary | ICD-10-CM | POA: Diagnosis not present

## 2018-04-18 DIAGNOSIS — S199XXA Unspecified injury of neck, initial encounter: Secondary | ICD-10-CM | POA: Diagnosis not present

## 2018-04-18 DIAGNOSIS — I959 Hypotension, unspecified: Secondary | ICD-10-CM | POA: Diagnosis not present

## 2018-04-18 DIAGNOSIS — R52 Pain, unspecified: Secondary | ICD-10-CM | POA: Diagnosis not present

## 2018-04-18 DIAGNOSIS — Y998 Other external cause status: Secondary | ICD-10-CM | POA: Diagnosis not present

## 2018-04-18 DIAGNOSIS — W06XXXA Fall from bed, initial encounter: Secondary | ICD-10-CM | POA: Diagnosis not present

## 2018-04-19 DIAGNOSIS — R1312 Dysphagia, oropharyngeal phase: Secondary | ICD-10-CM | POA: Diagnosis not present

## 2018-04-19 DIAGNOSIS — R293 Abnormal posture: Secondary | ICD-10-CM | POA: Diagnosis not present

## 2018-04-19 DIAGNOSIS — R278 Other lack of coordination: Secondary | ICD-10-CM | POA: Diagnosis not present

## 2018-04-19 DIAGNOSIS — M6281 Muscle weakness (generalized): Secondary | ICD-10-CM | POA: Diagnosis not present

## 2018-04-19 DIAGNOSIS — E1129 Type 2 diabetes mellitus with other diabetic kidney complication: Secondary | ICD-10-CM | POA: Diagnosis not present

## 2018-04-19 DIAGNOSIS — M6250 Muscle wasting and atrophy, not elsewhere classified, unspecified site: Secondary | ICD-10-CM | POA: Diagnosis not present

## 2018-04-19 DIAGNOSIS — R488 Other symbolic dysfunctions: Secondary | ICD-10-CM | POA: Diagnosis not present

## 2018-04-20 DIAGNOSIS — M6281 Muscle weakness (generalized): Secondary | ICD-10-CM | POA: Diagnosis not present

## 2018-04-20 DIAGNOSIS — R488 Other symbolic dysfunctions: Secondary | ICD-10-CM | POA: Diagnosis not present

## 2018-04-20 DIAGNOSIS — R1312 Dysphagia, oropharyngeal phase: Secondary | ICD-10-CM | POA: Diagnosis not present

## 2018-04-20 DIAGNOSIS — R278 Other lack of coordination: Secondary | ICD-10-CM | POA: Diagnosis not present

## 2018-04-20 DIAGNOSIS — E1129 Type 2 diabetes mellitus with other diabetic kidney complication: Secondary | ICD-10-CM | POA: Diagnosis not present

## 2018-04-20 DIAGNOSIS — M6250 Muscle wasting and atrophy, not elsewhere classified, unspecified site: Secondary | ICD-10-CM | POA: Diagnosis not present

## 2018-04-20 DIAGNOSIS — R293 Abnormal posture: Secondary | ICD-10-CM | POA: Diagnosis not present

## 2018-04-23 DIAGNOSIS — R488 Other symbolic dysfunctions: Secondary | ICD-10-CM | POA: Diagnosis not present

## 2018-04-23 DIAGNOSIS — R278 Other lack of coordination: Secondary | ICD-10-CM | POA: Diagnosis not present

## 2018-04-23 DIAGNOSIS — R293 Abnormal posture: Secondary | ICD-10-CM | POA: Diagnosis not present

## 2018-04-23 DIAGNOSIS — R1312 Dysphagia, oropharyngeal phase: Secondary | ICD-10-CM | POA: Diagnosis not present

## 2018-04-23 DIAGNOSIS — M6281 Muscle weakness (generalized): Secondary | ICD-10-CM | POA: Diagnosis not present

## 2018-04-23 DIAGNOSIS — E1129 Type 2 diabetes mellitus with other diabetic kidney complication: Secondary | ICD-10-CM | POA: Diagnosis not present

## 2018-04-23 DIAGNOSIS — M6250 Muscle wasting and atrophy, not elsewhere classified, unspecified site: Secondary | ICD-10-CM | POA: Diagnosis not present

## 2018-04-26 DIAGNOSIS — M6250 Muscle wasting and atrophy, not elsewhere classified, unspecified site: Secondary | ICD-10-CM | POA: Diagnosis not present

## 2018-04-26 DIAGNOSIS — R293 Abnormal posture: Secondary | ICD-10-CM | POA: Diagnosis not present

## 2018-04-26 DIAGNOSIS — E1129 Type 2 diabetes mellitus with other diabetic kidney complication: Secondary | ICD-10-CM | POA: Diagnosis not present

## 2018-04-26 DIAGNOSIS — R488 Other symbolic dysfunctions: Secondary | ICD-10-CM | POA: Diagnosis not present

## 2018-04-26 DIAGNOSIS — R278 Other lack of coordination: Secondary | ICD-10-CM | POA: Diagnosis not present

## 2018-04-26 DIAGNOSIS — M6281 Muscle weakness (generalized): Secondary | ICD-10-CM | POA: Diagnosis not present

## 2018-04-26 DIAGNOSIS — R1312 Dysphagia, oropharyngeal phase: Secondary | ICD-10-CM | POA: Diagnosis not present

## 2018-04-27 DIAGNOSIS — M6250 Muscle wasting and atrophy, not elsewhere classified, unspecified site: Secondary | ICD-10-CM | POA: Diagnosis not present

## 2018-04-27 DIAGNOSIS — R278 Other lack of coordination: Secondary | ICD-10-CM | POA: Diagnosis not present

## 2018-04-27 DIAGNOSIS — E1129 Type 2 diabetes mellitus with other diabetic kidney complication: Secondary | ICD-10-CM | POA: Diagnosis not present

## 2018-04-27 DIAGNOSIS — R488 Other symbolic dysfunctions: Secondary | ICD-10-CM | POA: Diagnosis not present

## 2018-04-27 DIAGNOSIS — R1312 Dysphagia, oropharyngeal phase: Secondary | ICD-10-CM | POA: Diagnosis not present

## 2018-04-27 DIAGNOSIS — M6281 Muscle weakness (generalized): Secondary | ICD-10-CM | POA: Diagnosis not present

## 2018-04-27 DIAGNOSIS — R293 Abnormal posture: Secondary | ICD-10-CM | POA: Diagnosis not present

## 2018-04-28 DIAGNOSIS — E1129 Type 2 diabetes mellitus with other diabetic kidney complication: Secondary | ICD-10-CM | POA: Diagnosis not present

## 2018-04-28 DIAGNOSIS — M6250 Muscle wasting and atrophy, not elsewhere classified, unspecified site: Secondary | ICD-10-CM | POA: Diagnosis not present

## 2018-04-28 DIAGNOSIS — R278 Other lack of coordination: Secondary | ICD-10-CM | POA: Diagnosis not present

## 2018-04-28 DIAGNOSIS — R293 Abnormal posture: Secondary | ICD-10-CM | POA: Diagnosis not present

## 2018-04-28 DIAGNOSIS — R488 Other symbolic dysfunctions: Secondary | ICD-10-CM | POA: Diagnosis not present

## 2018-04-28 DIAGNOSIS — R1312 Dysphagia, oropharyngeal phase: Secondary | ICD-10-CM | POA: Diagnosis not present

## 2018-04-28 DIAGNOSIS — M6281 Muscle weakness (generalized): Secondary | ICD-10-CM | POA: Diagnosis not present

## 2018-05-04 DIAGNOSIS — I10 Essential (primary) hypertension: Secondary | ICD-10-CM | POA: Diagnosis not present

## 2018-05-04 DIAGNOSIS — Z79899 Other long term (current) drug therapy: Secondary | ICD-10-CM | POA: Diagnosis not present

## 2018-05-13 DIAGNOSIS — E785 Hyperlipidemia, unspecified: Secondary | ICD-10-CM | POA: Diagnosis not present

## 2018-05-16 DIAGNOSIS — Z23 Encounter for immunization: Secondary | ICD-10-CM | POA: Diagnosis not present

## 2018-05-21 DIAGNOSIS — R197 Diarrhea, unspecified: Secondary | ICD-10-CM | POA: Diagnosis not present

## 2018-05-24 DIAGNOSIS — N182 Chronic kidney disease, stage 2 (mild): Secondary | ICD-10-CM | POA: Diagnosis not present

## 2018-05-24 DIAGNOSIS — E1129 Type 2 diabetes mellitus with other diabetic kidney complication: Secondary | ICD-10-CM | POA: Diagnosis not present

## 2018-05-24 DIAGNOSIS — M109 Gout, unspecified: Secondary | ICD-10-CM | POA: Diagnosis not present

## 2018-05-24 DIAGNOSIS — M6250 Muscle wasting and atrophy, not elsewhere classified, unspecified site: Secondary | ICD-10-CM | POA: Diagnosis not present

## 2018-05-24 DIAGNOSIS — R488 Other symbolic dysfunctions: Secondary | ICD-10-CM | POA: Diagnosis not present

## 2018-05-25 DIAGNOSIS — E1129 Type 2 diabetes mellitus with other diabetic kidney complication: Secondary | ICD-10-CM | POA: Diagnosis not present

## 2018-05-25 DIAGNOSIS — R293 Abnormal posture: Secondary | ICD-10-CM | POA: Diagnosis not present

## 2018-05-26 DIAGNOSIS — E1129 Type 2 diabetes mellitus with other diabetic kidney complication: Secondary | ICD-10-CM | POA: Diagnosis not present

## 2018-05-26 DIAGNOSIS — R293 Abnormal posture: Secondary | ICD-10-CM | POA: Diagnosis not present

## 2018-05-27 DIAGNOSIS — R293 Abnormal posture: Secondary | ICD-10-CM | POA: Diagnosis not present

## 2018-05-27 DIAGNOSIS — E1129 Type 2 diabetes mellitus with other diabetic kidney complication: Secondary | ICD-10-CM | POA: Diagnosis not present

## 2018-05-28 DIAGNOSIS — R293 Abnormal posture: Secondary | ICD-10-CM | POA: Diagnosis not present

## 2018-05-28 DIAGNOSIS — E1129 Type 2 diabetes mellitus with other diabetic kidney complication: Secondary | ICD-10-CM | POA: Diagnosis not present
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
## 2018-05-30 DIAGNOSIS — R293 Abnormal posture: Secondary | ICD-10-CM | POA: Diagnosis not present

## 2018-05-30 DIAGNOSIS — E1129 Type 2 diabetes mellitus with other diabetic kidney complication: Secondary | ICD-10-CM | POA: Diagnosis not present

## 2018-06-01 DIAGNOSIS — R293 Abnormal posture: Secondary | ICD-10-CM | POA: Diagnosis not present

## 2018-06-01 DIAGNOSIS — E1129 Type 2 diabetes mellitus with other diabetic kidney complication: Secondary | ICD-10-CM | POA: Diagnosis not present

## 2018-06-03 DIAGNOSIS — R293 Abnormal posture: Secondary | ICD-10-CM | POA: Diagnosis not present

## 2018-06-03 DIAGNOSIS — E1129 Type 2 diabetes mellitus with other diabetic kidney complication: Secondary | ICD-10-CM | POA: Diagnosis not present

## 2018-06-06 DIAGNOSIS — E1129 Type 2 diabetes mellitus with other diabetic kidney complication: Secondary | ICD-10-CM | POA: Diagnosis not present

## 2018-06-06 DIAGNOSIS — R293 Abnormal posture: Secondary | ICD-10-CM | POA: Diagnosis not present

## 2018-06-07 DIAGNOSIS — R293 Abnormal posture: Secondary | ICD-10-CM | POA: Diagnosis not present

## 2018-06-07 DIAGNOSIS — E1129 Type 2 diabetes mellitus with other diabetic kidney complication: Secondary | ICD-10-CM | POA: Diagnosis not present

## 2018-06-08 DIAGNOSIS — E1129 Type 2 diabetes mellitus with other diabetic kidney complication: Secondary | ICD-10-CM | POA: Diagnosis not present

## 2018-06-08 DIAGNOSIS — R293 Abnormal posture: Secondary | ICD-10-CM | POA: Diagnosis not present

## 2018-06-09 DIAGNOSIS — E1129 Type 2 diabetes mellitus with other diabetic kidney complication: Secondary | ICD-10-CM | POA: Diagnosis not present

## 2018-06-09 DIAGNOSIS — R293 Abnormal posture: Secondary | ICD-10-CM | POA: Diagnosis not present

## 2018-06-10 DIAGNOSIS — E119 Type 2 diabetes mellitus without complications: Secondary | ICD-10-CM | POA: Diagnosis not present

## 2018-06-10 DIAGNOSIS — R293 Abnormal posture: Secondary | ICD-10-CM | POA: Diagnosis not present

## 2018-06-10 DIAGNOSIS — R627 Adult failure to thrive: Secondary | ICD-10-CM | POA: Diagnosis not present

## 2018-06-10 DIAGNOSIS — E1129 Type 2 diabetes mellitus with other diabetic kidney complication: Secondary | ICD-10-CM | POA: Diagnosis not present

## 2018-06-11 DIAGNOSIS — K567 Ileus, unspecified: Secondary | ICD-10-CM | POA: Diagnosis not present

## 2018-06-11 DIAGNOSIS — E1129 Type 2 diabetes mellitus with other diabetic kidney complication: Secondary | ICD-10-CM | POA: Diagnosis not present

## 2018-06-11 DIAGNOSIS — Z7189 Other specified counseling: Secondary | ICD-10-CM | POA: Diagnosis not present

## 2018-06-11 DIAGNOSIS — R293 Abnormal posture: Secondary | ICD-10-CM | POA: Diagnosis not present

## 2018-06-11 DIAGNOSIS — K7200 Acute and subacute hepatic failure without coma: Secondary | ICD-10-CM | POA: Diagnosis not present

## 2018-07-04 DEATH — deceased

## 2018-08-17 IMAGING — DX DG CHEST 1V PORT
1 series · 1 of 1 positions shown · non-contrast
Comparison: 10/22/2017

CLINICAL DATA: Dementia with behavioral disturbance.

EXAM:
PORTABLE CHEST 1 VIEW

[chest ap]
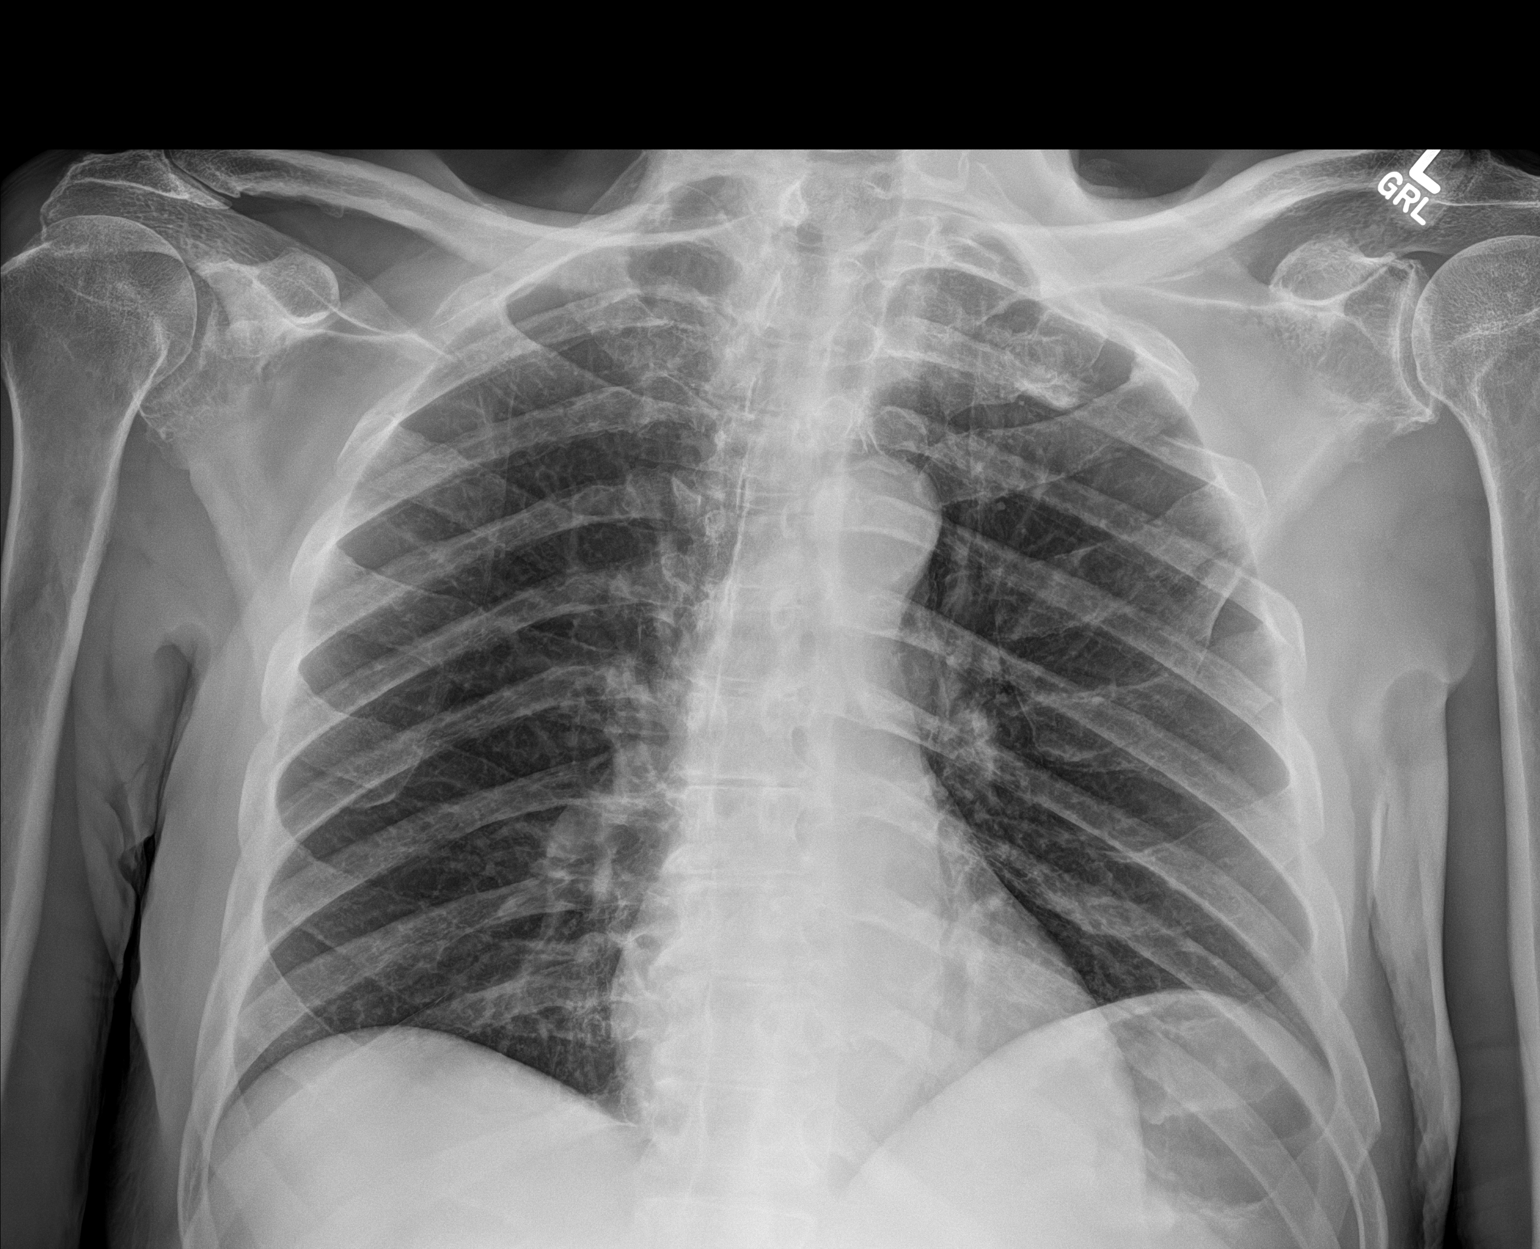

[1 of 1 positions shown; findings below may reference images not displayed]

FINDINGS: The heart size and mediastinal contours are within normal limits.
Aortic atherosclerosis without aneurysm noted. Both lungs are clear.
Osteoarthritic joint space narrowing and spurring about the AC
joints. Degenerative joint space narrowing of both glenohumeral
joints. No acute nor suspicious osseous abnormality.
IMPRESSION: No active disease.  Aortic atherosclerosis.
